# Patient Record
Sex: Male | Born: 1965 | Race: White | Hispanic: No | Marital: Single | Smoking: Current every day smoker
Health system: Southern US, Community
[De-identification: ages and names within clinical notes are randomized; demographics above are authoritative.]

## PROBLEM LIST (undated history)

## (undated) DIAGNOSIS — I10 Essential (primary) hypertension: Secondary | ICD-10-CM

## (undated) DIAGNOSIS — K219 Gastro-esophageal reflux disease without esophagitis: Secondary | ICD-10-CM

## (undated) DIAGNOSIS — M792 Neuralgia and neuritis, unspecified: Secondary | ICD-10-CM

## (undated) HISTORY — PX: GASTRIC BYPASS: SHX52

---

## 2011-03-05 ENCOUNTER — Emergency Department: Payer: Self-pay | Admitting: Emergency Medicine

## 2012-01-19 ENCOUNTER — Emergency Department (HOSPITAL_COMMUNITY): Payer: Self-pay

## 2012-01-19 ENCOUNTER — Emergency Department (HOSPITAL_COMMUNITY)
Admission: EM | Admit: 2012-01-19 | Discharge: 2012-01-19 | Disposition: A | Discharge status: Home Health | Payer: Self-pay | Attending: Emergency Medicine | Admitting: Emergency Medicine

## 2012-01-19 ENCOUNTER — Encounter (HOSPITAL_COMMUNITY): Payer: Self-pay

## 2012-01-19 ENCOUNTER — Other Ambulatory Visit: Payer: Self-pay

## 2012-01-19 DIAGNOSIS — R1110 Vomiting, unspecified: Secondary | ICD-10-CM | POA: Insufficient documentation

## 2012-01-19 DIAGNOSIS — Z79899 Other long term (current) drug therapy: Secondary | ICD-10-CM | POA: Insufficient documentation

## 2012-01-19 DIAGNOSIS — S91109A Unspecified open wound of unspecified toe(s) without damage to nail, initial encounter: Secondary | ICD-10-CM | POA: Insufficient documentation

## 2012-01-19 DIAGNOSIS — R0602 Shortness of breath: Secondary | ICD-10-CM | POA: Insufficient documentation

## 2012-01-19 DIAGNOSIS — R059 Cough, unspecified: Secondary | ICD-10-CM | POA: Insufficient documentation

## 2012-01-19 DIAGNOSIS — X58XXXA Exposure to other specified factors, initial encounter: Secondary | ICD-10-CM | POA: Insufficient documentation

## 2012-01-19 DIAGNOSIS — E669 Obesity, unspecified: Secondary | ICD-10-CM | POA: Insufficient documentation

## 2012-01-19 DIAGNOSIS — I10 Essential (primary) hypertension: Secondary | ICD-10-CM | POA: Insufficient documentation

## 2012-01-19 DIAGNOSIS — R05 Cough: Secondary | ICD-10-CM | POA: Insufficient documentation

## 2012-01-19 DIAGNOSIS — J3489 Other specified disorders of nose and nasal sinuses: Secondary | ICD-10-CM | POA: Insufficient documentation

## 2012-01-19 DIAGNOSIS — M549 Dorsalgia, unspecified: Secondary | ICD-10-CM | POA: Insufficient documentation

## 2012-01-19 DIAGNOSIS — E119 Type 2 diabetes mellitus without complications: Secondary | ICD-10-CM | POA: Insufficient documentation

## 2012-01-19 DIAGNOSIS — R079 Chest pain, unspecified: Secondary | ICD-10-CM | POA: Insufficient documentation

## 2012-01-19 DIAGNOSIS — Z794 Long term (current) use of insulin: Secondary | ICD-10-CM | POA: Insufficient documentation

## 2012-01-19 DIAGNOSIS — M940 Chondrocostal junction syndrome [Tietze]: Secondary | ICD-10-CM | POA: Insufficient documentation

## 2012-01-19 HISTORY — DX: Neuralgia and neuritis, unspecified: M79.2

## 2012-01-19 HISTORY — DX: Essential (primary) hypertension: I10

## 2012-01-19 LAB — COMPREHENSIVE METABOLIC PANEL
ALT: 14 U/L (ref 0–53)
AST: 15 U/L (ref 0–37)
CO2: 24 mEq/L (ref 19–32)
Calcium: 9.3 mg/dL (ref 8.4–10.5)
Chloride: 105 mEq/L (ref 96–112)
Creatinine, Ser: 0.9 mg/dL (ref 0.50–1.35)
GFR calc Af Amer: >90 mL/min (ref >90)
GFR calc non Af Amer: >90 mL/min (ref >90)
Glucose, Bld: 109 mg/dL — ABNORMAL HIGH (ref 70–99)
Total Bilirubin: 0.4 mg/dL (ref 0.3–1.2)

## 2012-01-19 LAB — POCT I-STAT TROPONIN I

## 2012-01-19 LAB — CBC
Hemoglobin: 13.2 g/dL (ref 13.0–17.0)
MCH: 29.8 pg (ref 26.0–34.0)
MCV: 84.7 fL (ref 78.0–100.0)
RBC: 4.43 MIL/uL (ref 4.22–5.81)
WBC: 6.1 10*3/uL (ref 4.0–10.5)

## 2012-01-19 LAB — PROTIME-INR: INR: 1.06 (ref 0.00–1.49)

## 2012-01-19 LAB — APTT: aPTT: 29 seconds (ref 24–37)

## 2012-01-19 MED ORDER — NAPROXEN 500 MG PO TABS: 500.0000 mg | ORAL_TABLET | Freq: Two times a day (BID) | ORAL | Status: DC

## 2012-01-19 MED ORDER — MORPHINE SULFATE 4 MG/ML IJ SOLN
4.0000 mg | Freq: Once | INTRAMUSCULAR | Status: AC
  Administered 2012-01-19: 4 mg via INTRAVENOUS
  Filled 2012-01-19: qty 1

## 2012-01-19 MED ORDER — HYDROCODONE-ACETAMINOPHEN 5-325 MG PO TABS: 1.0000 | ORAL_TABLET | Freq: Four times a day (QID) | ORAL | Status: DC | PRN

## 2012-01-19 MED ORDER — SODIUM CHLORIDE 0.9 % IV SOLN
20.0000 mL | INTRAVENOUS | Status: DC
  Administered 2012-01-19: 20 mL via INTRAVENOUS

## 2012-01-19 NOTE — ED Notes (Signed)
Pt presents with midsternal chest pain that woke him from sleep last night.  +shortness of breath and nausea.  Pt reports pain has been constant and radiates straight through to his back.

## 2012-01-19 NOTE — ED Notes (Signed)
Patient denies pain and is resting comfortably.  

## 2012-01-19 NOTE — ED Provider Notes (Signed)
History     CSN: [id number]  Arrival date & time 01/19/12  1029   First MD Initiated Contact with Patient 01/19/12 1120      Chief Complaint  Patient presents with  . Chest Pain    (Consider location/radiation/quality/duration/timing/severity/associated sxs/prior treatment) HPI Pt started last night with a cough.  Pt then developed pain in his mid chest radiating to his back.  The pain is sharp and constant.  It increases with coughing.  He vomited this morning.  No diarrhea or stomach pain.  He had this once before when he was in prison associated with a lung infection.  NO recent trips or travel.  No history of blood clots or heart disease. Past Medical History  Diagnosis Date  . Diabetes mellitus   . Hypertension   . Neuropathic pain     History reviewed. No pertinent past surgical history.  No family history on file.  History  Substance Use Topics  . Smoking status: Current Everyday Smoker -- 0.5 packs/day  . Smokeless tobacco: Not on file  . Alcohol Use: No      Review of Systems  Constitutional: Negative for fever.  HENT: Positive for congestion.   Respiratory: Positive for cough and shortness of breath.   Cardiovascular: Positive for chest pain.  Gastrointestinal: Negative for abdominal pain.  Musculoskeletal: Positive for back pain.  Skin: Negative for pallor.  Neurological: Negative for syncope.  All other systems reviewed and are negative.    Allergies  Review of patient's allergies indicates no known allergies.  Home Medications   Current Outpatient Rx  Name Route Sig Dispense Refill  . ALBUTEROL SULFATE HFA 108 (90 BASE) MCG/ACT IN AERS Inhalation Inhale 2 puffs into the lungs 2 (two) times daily as needed. For shortness of breath.    Marland Kitchen HYDROXYZINE PAMOATE 25 MG PO CAPS Oral Take 25 mg by mouth 2 (two) times daily.     . INSULIN ASPART PROT & ASPART (70-30) 100 UNIT/ML [place] SUSP Subcutaneous Inject 30 Units into the skin 3 (three) times daily.    Marland Kitchen  METFORMIN HCL 500 MG PO TABS Oral Take 500 mg by mouth 2 (two) times daily with a meal.    . RISPERIDONE 0.25 MG PO TABS Oral Take 0.25 mg by mouth 2 (two) times daily.      BP 113/65  Pulse 99  Temp(Src) 99 F (37.2 C) (Oral)  Resp 26  Ht 6\' 1"  (1.854 m)  Wt 240 lb (108.863 kg)  BMI 31.66 kg/m2  SpO2 99%  Physical Exam  Nursing note and vitals reviewed. Constitutional: He appears well-developed and well-nourished. No distress.       obese  HENT:  Head: Normocephalic and atraumatic.  Right Ear: External ear normal.  Left Ear: External ear normal.  Eyes: Conjunctivae are normal. Right eye exhibits no discharge. Left eye exhibits no discharge. No scleral icterus.  Neck: Neck supple. No tracheal deviation present.  Cardiovascular: Normal rate, regular rhythm and intact distal pulses.   Pulmonary/Chest: Effort normal and breath sounds normal. No stridor. No respiratory distress. He has no wheezes. He has no rales. He exhibits tenderness.  Abdominal: Soft. Bowel sounds are normal. He exhibits no distension. There is no tenderness. There is no rebound and no guarding.  Musculoskeletal: He exhibits no edema and no tenderness.       Status post avulsion right great toenail  Neurological: He is alert. He has normal strength. No sensory deficit. Cranial nerve deficit:  no gross defecits  noted. He exhibits normal muscle tone. He displays no seizure activity. Coordination normal.  Skin: Skin is warm and dry. No rash noted.  Psychiatric: He has a normal mood and affect.    ED Course  Procedures (including critical care time)  Date: 01/19/2012  Rate: 105  Rhythm: sinus tachycardia  QRS Axis: normal  Intervals: normal  ST/T Wave abnormalities: normal  Conduction Disutrbances:none  Narrative Interpretation:   Old EKG Reviewed: none available   Labs Reviewed  CBC - Abnormal; Notable for the following:    HCT 37.5 (*)    All other components within normal limits  COMPREHENSIVE  METABOLIC PANEL - Abnormal; Notable for the following:    Potassium 3.2 (*)    Glucose, Bld 109 (*)    Albumin 3.3 (*)    All other components within normal limits  PROTIME-INR  APTT  D-DIMER, QUANTITATIVE  POCT I-STAT TROPONIN I   Dg Chest Portable 1 View  01/19/2012  *RADIOLOGY REPORT*  Clinical Data: Chest pain.  PORTABLE CHEST - 1 VIEW  Comparison: None.  Findings: Trachea is midline.  Heart size normal.  Lungs are low in volume with minimal bibasilar atelectasis.  No pleural fluid.  IMPRESSION: Low lung volumes with minimal bibasilar atelectasis.  Original Report Authenticated By: Reyes Ivan, M.D.     1. Costochondritis       MDM  I suspect the patient is having chest wall pain associated with his file respiratory infection. At this time there is no evidence of pneumonia. He has no pulmonary embolism risk factors and his d-dimer is negative. I doubt aortic dissection, there is no widened mediastinum. Patient will be discharged home with medications for pain        Celene Kras, MD 01/19/12 [phone]

## 2012-01-19 NOTE — Discharge Instructions (Signed)
Costochondritis Costochondritis (Tietze syndrome), or costochondral separation, is a swelling and irritation (inflammation) of the tissue (cartilage) that connects your ribs with your breastbone (sternum). It may occur on its own (spontaneously), through damage caused by an accident (trauma), or simply from coughing or minor exercise. It may take up to 6 weeks to get better and longer if you are unable to be conservative in your activities. HOME CARE INSTRUCTIONS   Avoid exhausting physical activity. Try not to strain your ribs during normal activity. This would include any activities using chest, belly (abdominal), and side muscles, especially if heavy weights are used.   Use ice for 15 to 20 minutes per hour while awake for the first 2 days. Place the ice in a plastic bag, and place a towel between the bag of ice and your skin.   Only take over-the-counter or prescription medicines for pain, discomfort, or fever as directed by your caregiver.  SEEK IMMEDIATE MEDICAL CARE IF:   Your pain increases or you are very uncomfortable.   You have a fever.   You develop difficulty with your breathing.   You cough up blood.   You develop worse chest pains, shortness of breath, sweating, or vomiting.   You develop new, unexplained problems (symptoms).  MAKE SURE YOU:   Understand these instructions.   Will watch your condition.   Will get help right away if you are not doing well or get worse.  Document Released: 08/10/2005 Document Revised: 10/20/2011 Document Reviewed: 06/18/2008 [facility] Patient Information 2012 [place], [place]. RESOURCE GUIDE  Dental Problems  Patients with Medicaid: [facility]                     [phone] [address]                                           Phone:  [phone]                                                  If unable to pay or uninsured, contact:  [facility] or [facility]. to become qualified for the adult dental  clinic.  Chronic Pain Problems Contact [facility]  [phone] Patients need to be referred by their primary care doctor.  Insufficient Money for Medicine Contact United Way:  call "211" or [facility] [phone].  No Primary Care Doctor Call [facility]  [phone] Other agencies that provide inexpensive medical care    [facility]  [phone]    [facility] Internal [facility]  [phone]    [facility]  [phone]    [facility]  [phone]    [facility]  [phone]    [facility] Child [facility]  [phone]  Substance Abuse Resources [facility]  [phone] [facility] [phone] [facility] [phone] Floydene Flock [phone] Residential & Outpatient Substance Abuse Program  [phone]  Psychological Services [facility] Behavioral Health  [phone] [facility]  [phone] [facility] Mental Health   [phone] (emergency services [phone])  Abuse/Neglect [facility] Child Abuse Hotline 947-857-2744 [facility] Child Abuse Hotline [phone] (After Hours)  Emergency Shelter [facility] 6107571704  Maternity Homes  Room at the [facility] [place] [facility] [phone] [facility] [phone]  MRSA Hotline #:   667-356-9767    [facility] Resources  [facility]  [employer]                           [facility] Dept. [address] [place]                     [address]         [address]  Blondell Reveal Phone:  [phone]                                  Phone:  [phone]                   Phone:  [phone]  [facility] Mental Health Phone:  [phone]  [facility] Child Abuse Hotline 225-675-8147 [phone] (After Hours)

## 2012-01-19 NOTE — ED Notes (Signed)
Recently released from prison, states no medications since then and has been living in a shelter. States cough last pm and awoke with severe sob and cp this am.

## 2012-01-20 ENCOUNTER — Emergency Department (HOSPITAL_COMMUNITY)
Admission: EM | Admit: 2012-01-20 | Discharge: 2012-01-23 | Disposition: A | Discharge status: Home / Self Care | Payer: Self-pay | Attending: Emergency Medicine | Admitting: Emergency Medicine

## 2012-01-20 DIAGNOSIS — R45851 Suicidal ideations: Secondary | ICD-10-CM

## 2012-01-20 DIAGNOSIS — R5381 Other malaise: Secondary | ICD-10-CM | POA: Insufficient documentation

## 2012-01-20 DIAGNOSIS — F3289 Other specified depressive episodes: Secondary | ICD-10-CM | POA: Insufficient documentation

## 2012-01-20 DIAGNOSIS — I10 Essential (primary) hypertension: Secondary | ICD-10-CM | POA: Insufficient documentation

## 2012-01-20 DIAGNOSIS — F329 Major depressive disorder, single episode, unspecified: Secondary | ICD-10-CM | POA: Insufficient documentation

## 2012-01-20 DIAGNOSIS — R5383 Other fatigue: Secondary | ICD-10-CM | POA: Insufficient documentation

## 2012-01-20 DIAGNOSIS — F32A Depression, unspecified: Secondary | ICD-10-CM

## 2012-01-20 DIAGNOSIS — Z794 Long term (current) use of insulin: Secondary | ICD-10-CM | POA: Insufficient documentation

## 2012-01-20 DIAGNOSIS — E119 Type 2 diabetes mellitus without complications: Secondary | ICD-10-CM | POA: Insufficient documentation

## 2012-01-20 DIAGNOSIS — R079 Chest pain, unspecified: Secondary | ICD-10-CM | POA: Insufficient documentation

## 2012-01-20 LAB — COMPREHENSIVE METABOLIC PANEL
Albumin: 3.8 g/dL (ref 3.5–5.2)
Alkaline Phosphatase: 80 U/L (ref 39–117)
BUN: 16 mg/dL (ref 6–23)
CO2: 21 mEq/L (ref 19–32)
Chloride: 104 mEq/L (ref 96–112)
Creatinine, Ser: 0.81 mg/dL (ref 0.50–1.35)
GFR calc Af Amer: >90 mL/min (ref >90)
GFR calc non Af Amer: >90 mL/min (ref >90)
Glucose, Bld: 128 mg/dL — ABNORMAL HIGH (ref 70–99)
Potassium: 3.4 mEq/L — ABNORMAL LOW (ref 3.5–5.1)
Total Bilirubin: 0.4 mg/dL (ref 0.3–1.2)

## 2012-01-20 LAB — CBC
HCT: 38.9 % — ABNORMAL LOW (ref 39.0–52.0)
Hemoglobin: 13.6 g/dL (ref 13.0–17.0)
MCV: 83.7 fL (ref 78.0–100.0)
RDW: 14.4 % (ref 11.5–15.5)
WBC: 5.1 10*3/uL (ref 4.0–10.5)

## 2012-01-20 LAB — ETHANOL: Alcohol, Ethyl (B): <11 mg/dL (ref 0–11)

## 2012-01-20 LAB — RAPID URINE DRUG SCREEN, HOSP PERFORMED
Barbiturates: NOT DETECTED
Benzodiazepines: NOT DETECTED

## 2012-01-20 LAB — ACETAMINOPHEN LEVEL: Acetaminophen (Tylenol), Serum: <15 ug/mL (ref 10–30)

## 2012-01-20 LAB — GLUCOSE, CAPILLARY: Glucose-Capillary: 123 mg/dL — ABNORMAL HIGH (ref 70–99)

## 2012-01-20 MED ORDER — HYDROCOD POLST-CHLORPHEN POLST 10-8 MG/5ML PO LQCR
5.0000 mL | Freq: Once | ORAL | Status: AC
  Administered 2012-01-20: 5 mL via ORAL
  Filled 2012-01-20: qty 5

## 2012-01-20 MED ORDER — LORAZEPAM 1 MG PO TABS
1.0000 mg | ORAL_TABLET | Freq: Three times a day (TID) | ORAL | Status: DC | PRN
  Administered 2012-01-22: 1 mg via ORAL
  Filled 2012-01-20: qty 1

## 2012-01-20 NOTE — ED Notes (Signed)
Pt lives at the shelter stating he has a lot of suicidal thoughts "real bad" this last week.  Called the mobile crisis unit.  Here w/[employer].

## 2012-01-20 NOTE — ED Notes (Signed)
Pt states he has been depressed and feels suicidal with a plan to jump off a bridge into the path of a train.  Denies HI.  Says the depression worsened after being released from prison last month.

## 2012-01-20 NOTE — ED Provider Notes (Signed)
History     CSN: [id number]  Arrival date & time 01/20/12  1456   First MD Initiated Contact with Patient 01/20/12 1548      Chief Complaint  Patient presents with  . Suicidal    (Consider location/radiation/quality/duration/timing/severity/associated sxs/prior treatment) HPI Comments: Recently released from jail.  Staying in homeless shelter.    Patient is a 46 y.o. male presenting with mental health disorder. The history is provided by the patient.  Mental Health Problem The primary symptoms include dysphoric mood. The current episode started more than 1 month ago. This is a recurrent problem.  The degree of incapacity that he is experiencing as a consequence of his illness is severe. Sequelae of the illness include an inability to work, harmed interpersonal relations, an inability to care for self and homelessness. Additional symptoms of the illness include anhedonia, insomnia, fatigue and feelings of worthlessness. He admits to suicidal ideas. He does have a plan to commit suicide. He contemplates harming himself. He has not already injured self. He does not contemplate injuring another person. He has not already  injured another person. Risk factors that are present for mental illness include a history of mental illness.    Past Medical History  Diagnosis Date  . Diabetes mellitus   . Hypertension   . Neuropathic pain     No past surgical history on file.  No family history on file.  History  Substance Use Topics  . Smoking status: Current Everyday Smoker -- 0.5 packs/day  . Smokeless tobacco: Not on file  . Alcohol Use: No      Review of Systems  Constitutional: Positive for fatigue.  Psychiatric/Behavioral: Positive for dysphoric mood. The patient has insomnia.   All other systems reviewed and are negative.    Allergies  Review of patient's allergies indicates no known allergies.  Home Medications   Current Outpatient Rx  Name Route Sig Dispense Refill    . ALBUTEROL SULFATE HFA 108 (90 BASE) MCG/ACT IN AERS Inhalation Inhale 2 puffs into the lungs 2 (two) times daily as needed. For shortness of breath.    . BENZTROPINE MESYLATE 1 MG PO TABS Oral Take 1 mg by mouth 2 (two) times daily.    . INSULIN ASPART PROT & ASPART (70-30) 100 UNIT/ML [place] SUSP Subcutaneous Inject 30 Units into the skin 3 (three) times daily.    Marland Kitchen METFORMIN HCL 500 MG PO TABS Oral Take 500 mg by mouth 2 (two) times daily with a meal.    . NAPROXEN 500 MG PO TABS Oral Take 1 tablet (500 mg total) by mouth 2 (two) times daily. 30 tablet 0  . RISPERIDONE 0.25 MG PO TABS Oral Take 0.25 mg by mouth 2 (two) times daily.      BP 119/72  Pulse 79  Temp 98.5 F (36.9 C)  Resp 20  SpO2 97%  Physical Exam  Nursing note and vitals reviewed. Constitutional: He appears well-developed and well-nourished. No distress.  HENT:  Head: Normocephalic and atraumatic.  Neck: Normal range of motion. Neck supple.  Cardiovascular: Normal rate and regular rhythm.   No murmur heard. Pulmonary/Chest: Effort normal and breath sounds normal. No respiratory distress.  Abdominal: Soft. Bowel sounds are normal.  Musculoskeletal: Normal range of motion. He exhibits no edema.  Lymphadenopathy:    He has no cervical adenopathy.  Neurological: He is alert.  Skin: Skin is warm and dry. He is not diaphoretic.  Psychiatric:       Affect flat, speech slow.  ED Course  Procedures (including critical care time)   Labs Reviewed  CBC  COMPREHENSIVE METABOLIC PANEL  ETHANOL  ACETAMINOPHEN LEVEL  URINE RAPID DRUG SCREEN (HOSP PERFORMED)   Dg Chest Portable 1 View  01/19/2012  *RADIOLOGY REPORT*  Clinical Data: Chest pain.  PORTABLE CHEST - 1 VIEW  Comparison: None.  Findings: Trachea is midline.  Heart size normal.  Lungs are low in volume with minimal bibasilar atelectasis.  No pleural fluid.  IMPRESSION: Low lung volumes with minimal bibasilar atelectasis.  Original Report Authenticated By:  Reyes Ivan, M.D.     No diagnosis found.    MDM  Act team consulted, working on disposition.        Geoffery Lyons, MD 01/22/12 1213

## 2012-01-20 NOTE — BH Assessment (Signed)
Assessment Note   Randall Carroll is a 46 y.o. male who presents to [facility] ED voluntarily, endorsing SI with plan to jump off bridge. Pt reports he was released from jail 01/07/12 and was taken to a homeless shelter. He reports he was given no housing resources at that time. He reports since being at the shelter he had an increase in depressive symptoms and SI. He reports he currently has a plan to jump off a bridge. Pt reports he has attempted suicide 7 times since the death of his girlfriend in 1983/05/02. He reports he has attempted to overdoes, hang himself, and cut his wrists. He states he has been hospitalized 7 times, 5 times in [place], 1 time in [place], and 1 time at [facility]. Pt reports he has a history of schizophrenia, bipolar, mood disorder, and PTSD. He endorses [facility], stating he hears the voices of his deceased mother and girlfriend telling him to "come be with [place], It's better over here." Pt reports he was prescribed Seroquel but is non-compliant because it makes him "want to hurt people bad."  Pt denies current HI.  Pt reports no current psychiatrist. He reports he has seen Barbaraann Barthel at [facility] time. Pt is currently on probation for breaking and entering. Pt is voluntarily seeking inpatient treatment at this time.      Axis I: Mood Disorder NOS Axis II: Deferred Axis III:  Past Medical History  Diagnosis Date  . Diabetes mellitus   . Hypertension   . Neuropathic pain    Axis IV: housing problems, problems related to legal system/crime and problems related to social environment Axis V: 31-40 impairment in reality testing  Past Medical History:  Past Medical History  Diagnosis Date  . Diabetes mellitus   . Hypertension   . Neuropathic pain     No past surgical history on file.  Family History: No family history on file.  Social History:  reports that he has been smoking.  He does not have any smokeless tobacco history on file. He reports that he  does not drink alcohol or use illicit drugs.  Additional Social History:  Alcohol / Drug Use History of alcohol / drug use?: No history of alcohol / drug abuse Allergies: No Known Allergies  Home Medications:  Medications Prior to Admission  Medication Dose Route Frequency Provider Last Rate Last Dose  . chlorpheniramine-HYDROcodone (TUSSIONEX) 10-8 MG/5ML suspension 5 mL  5 mL Oral Once Geoffery Lyons, MD   5 mL at 01/20/12 05/01/2002  . LORazepam (ATIVAN) tablet 1 mg  1 mg Oral Q8H PRN Geoffery Lyons, MD      . morphine 4 MG/ML injection 4 mg  4 mg Intravenous Once Celene Kras, MD   4 mg at 01/19/12 1150  . DISCONTD: 0.9 %  sodium chloride infusion  20 mL Intravenous Continuous Celene Kras, MD 10 mL/hr at 01/19/12 1150 20 mL at 01/19/12 1150   Medications Prior to Admission  Medication Sig Dispense Refill  . albuterol (PROVENTIL HFA;VENTOLIN HFA) 108 (90 BASE) MCG/ACT inhaler Inhale 2 puffs into the lungs 2 (two) times daily as needed. For shortness of breath.      . insulin aspart protamine-insulin aspart (NOVOLOG 70/30) (70-30) 100 UNIT/ML injection Inject 30 Units into the skin 3 (three) times daily.      . metFORMIN (GLUCOPHAGE) 500 MG tablet Take 500 mg by mouth 2 (two) times daily with a meal.      .  naproxen (NAPROSYN) 500 MG tablet Take 1 tablet (500 mg total) by mouth 2 (two) times daily.  30 tablet  0  . risperiDONE (RISPERDAL) 0.25 MG tablet Take 0.25 mg by mouth 2 (two) times daily.        OB/GYN Status:  No LMP for male patient.  General Assessment Data Location of Assessment: [facility] ED Living Arrangements: Homeless Can pt return to current living arrangement?: Yes Admission Status: Voluntary Is patient capable of signing voluntary admission?: Yes Transfer from: Acute Hospital Referral Source: Self/Family/Friend  Education Status Is patient currently in school?: No  Risk to self Suicidal Ideation: Yes-Currently Present Suicidal Intent: Yes-Currently Present Is patient at risk  for suicide?: Yes Suicidal Plan?: Yes-Currently Present Specify Current Suicidal Plan: plan to jump off bridge Access to Means: Yes Specify Access to Suicidal Means: bridge What has been your use of drugs/alcohol within the last 12 months?: denies Previous Attempts/Gestures: Yes How many times?: 7  Other Self Harm Risks: none Triggers for Past Attempts: Unpredictable Intentional Self Injurious Behavior: None Family Suicide History: No Recent stressful life event(s): Other (Comment) (recently released from jail) Persecutory voices/beliefs?: No Depression: Yes Depression Symptoms: Feeling angry/irritable;Feeling worthless/self pity;Isolating Substance abuse history and/or treatment for substance abuse?: No Suicide prevention information given to non-admitted patients: Not applicable  Risk to Others Homicidal Ideation: No Thoughts of Harm to Others: No Current Homicidal Intent: No Current Homicidal Plan: No Access to Homicidal Means: No Identified Victim: none History of harm to others?: No Assessment of Violence: None Noted Violent Behavior Description: none Does patient have access to weapons?: No Criminal Charges Pending?: No (currently on probabtion) Does patient have a court date: No Court Date:  (pt is unsure )  Psychosis Hallucinations: Auditory;Visual Delusions: None noted  Mental Status Report Appear/Hygiene: Disheveled Eye Contact: Good Motor Activity: Unremarkable Speech: Logical/coherent Level of Consciousness: Alert Mood: Apathetic Affect: Inconsistent with thought content (smiling when talking about jumpting off a bridge) Anxiety Level: Minimal Thought Processes: Relevant;Coherent Judgement: Impaired Orientation: Person;Place;Time;Situation Obsessive Compulsive Thoughts/Behaviors: None  Cognitive Functioning Concentration: Normal Memory: Recent Intact;Remote Intact IQ: Average Insight: Fair Impulse Control: Poor Appetite: Good Weight Loss: 0    Weight Gain: 0  Sleep: Decreased Vegetative Symptoms: None  Prior Inpatient Therapy Prior Inpatient Therapy: Yes Prior Therapy Dates: off and on since 1984 Prior Therapy Facilty/Provider(s): In [place], [place], and [place] [facility] Reason for Treatment: suicide attempts  Prior Outpatient Therapy Prior Outpatient Therapy: No  ADL Screening (condition at time of admission) Patient's cognitive ability adequate to safely complete daily activities?: Yes Patient able to express need for assistance with ADLs?: Yes Independently performs ADLs?: Yes  Home Assistive Devices/Equipment Home Assistive Devices/Equipment: None    Abuse/Neglect Assessment (Assessment to be complete while patient is alone) Physical Abuse: Denies Verbal Abuse: Denies Sexual Abuse: Denies Exploitation of patient/patient's resources: Denies Self-Neglect: Denies     Merchant navy officer (For Healthcare) Advance Directive: Patient does not have advance directive;Patient would not like information Nutrition Screen Diet: Regular Unintentional weight loss greater than 10lbs within the last month: No Dysphagia: No Home Tube Feeding or Total Parenteral Nutrition (TPN): No Patient appears severely malnourished: No Pregnant or Lactating: No  Additional Information 1:1 In Past 12 Months?: No CIRT Risk: No Elopement Risk: No Does patient have medical clearance?: Yes     Disposition:  Disposition Disposition of Patient: Inpatient treatment program;Referred to [facility]) Type of inpatient treatment program: Adult  On Site Evaluation by:   Reviewed with Physician:  Georgina Quint A 01/20/2012 9:39 PM

## 2012-01-21 ENCOUNTER — Encounter (HOSPITAL_COMMUNITY): Payer: Self-pay

## 2012-01-21 ENCOUNTER — Emergency Department (HOSPITAL_COMMUNITY): Payer: Self-pay

## 2012-01-21 LAB — URINALYSIS, ROUTINE W REFLEX MICROSCOPIC
Bilirubin Urine: NEGATIVE
Glucose, UA: 500 mg/dL — AB
Hgb urine dipstick: NEGATIVE
Protein, ur: NEGATIVE mg/dL

## 2012-01-21 LAB — GLUCOSE, CAPILLARY
Glucose-Capillary: 129 mg/dL — ABNORMAL HIGH (ref 70–99)
Glucose-Capillary: 103 mg/dL — ABNORMAL HIGH (ref 70–99)
Glucose-Capillary: 187 mg/dL — ABNORMAL HIGH (ref 70–99)

## 2012-01-21 LAB — RAPID STREP SCREEN (MED CTR MEBANE ONLY): Streptococcus, Group A Screen (Direct): NEGATIVE

## 2012-01-21 MED ORDER — NAPROXEN 500 MG PO TABS
500.0000 mg | ORAL_TABLET | Freq: Two times a day (BID) | ORAL | Status: DC
  Administered 2012-01-21 – 2012-01-23 (×5): 500 mg via ORAL
  Filled 2012-01-21 (×5): qty 1

## 2012-01-21 MED ORDER — BENZTROPINE MESYLATE 1 MG PO TABS
1.0000 mg | ORAL_TABLET | Freq: Two times a day (BID) | ORAL | Status: DC
Start: 2012-01-21 — End: 2012-01-23
  Administered 2012-01-21 – 2012-01-23 (×5): 1 mg via ORAL
  Filled 2012-01-21 (×5): qty 1

## 2012-01-21 MED ORDER — RISPERIDONE 0.5 MG PO TABS
0.2500 mg | ORAL_TABLET | Freq: Two times a day (BID) | ORAL | Status: DC
  Administered 2012-01-21 (×2): 0.25 mg via ORAL
  Administered 2012-01-22: 0.5 mg via ORAL
  Administered 2012-01-22 – 2012-01-23 (×2): 0.25 mg via ORAL
  Filled 2012-01-21 (×5): qty 1

## 2012-01-21 MED ORDER — INSULIN ASPART PROT & ASPART (70-30 MIX) 100 UNIT/ML ~~LOC~~ SUSP
30.0000 [IU] | Freq: Three times a day (TID) | SUBCUTANEOUS | Status: DC
Start: 2012-01-21 — End: 2012-01-21
  Filled 2012-01-21: qty 3

## 2012-01-21 MED ORDER — ONDANSETRON 4 MG PO TBDP
4.0000 mg | ORAL_TABLET | Freq: Once | ORAL | Status: AC
  Administered 2012-01-21: 4 mg via ORAL
  Filled 2012-01-21: qty 1

## 2012-01-21 MED ORDER — OXYMETAZOLINE HCL 0.05 % NA SOLN
1.0000 | Freq: Two times a day (BID) | NASAL | Status: DC | PRN
  Administered 2012-01-21 – 2012-01-23 (×3): 1 via NASAL
  Filled 2012-01-21: qty 15

## 2012-01-21 MED ORDER — INSULIN ASPART PROT & ASPART (70-30 MIX) 100 UNIT/ML ~~LOC~~ SUSP
30.0000 [IU] | Freq: Two times a day (BID) | SUBCUTANEOUS | Status: DC
  Administered 2012-01-21: 30 [IU] via SUBCUTANEOUS

## 2012-01-21 MED ORDER — GUAIFENESIN-CODEINE 100-10 MG/5ML PO SOLN
10.0000 mL | Freq: Once | ORAL | Status: AC
  Administered 2012-01-21: 10 mL via ORAL
  Filled 2012-01-21: qty 10

## 2012-01-21 MED ORDER — ALBUTEROL SULFATE HFA 108 (90 BASE) MCG/ACT IN AERS: 2.0000 | INHALATION_SPRAY | RESPIRATORY_TRACT | Status: DC | PRN

## 2012-01-21 MED ORDER — ACETAMINOPHEN 325 MG PO TABS
650.0000 mg | ORAL_TABLET | Freq: Once | ORAL | Status: AC
  Administered 2012-01-21: 650 mg via ORAL
  Filled 2012-01-21: qty 2

## 2012-01-21 MED ORDER — INSULIN ASPART PROT & ASPART (70-30 MIX) 100 UNIT/ML ~~LOC~~ SUSP
30.0000 [IU] | Freq: Two times a day (BID) | SUBCUTANEOUS | Status: DC
  Administered 2012-01-21 – 2012-01-22 (×2): 30 [IU] via SUBCUTANEOUS

## 2012-01-21 MED ORDER — ACETAMINOPHEN 325 MG PO TABS
650.0000 mg | ORAL_TABLET | Freq: Four times a day (QID) | ORAL | Status: DC | PRN
  Administered 2012-01-21: 650 mg via ORAL
  Filled 2012-01-21: qty 2

## 2012-01-21 MED ORDER — METFORMIN HCL 500 MG PO TABS
500.0000 mg | ORAL_TABLET | Freq: Two times a day (BID) | ORAL | Status: DC
  Administered 2012-01-21 – 2012-01-23 (×4): 500 mg via ORAL
  Filled 2012-01-21 (×9): qty 1

## 2012-01-21 NOTE — ED Notes (Signed)
Pt stated he has diarrhea and was incont of stool while he was sleeping. Stated he has had diarrhea for last 24 hours.  He is asking for med for the diarrhea.

## 2012-01-21 NOTE — ED Notes (Signed)
Spoke with Idalia Needle pt is pending a bed at [facility] no need for tele psych

## 2012-01-21 NOTE — ED Provider Notes (Signed)
Pt alert, content. Vitals normal. Act eval/telepsych pending.   Suzi Roots, MD 01/21/12 1024

## 2012-01-21 NOTE — ED Provider Notes (Addendum)
BP 110/74  Pulse 96  Temp(Src) 100.7 F (38.2 C) (Oral)  Resp 20  SpO2 100%   Patient evaluated by me. Low grade temp. C/O cough, min sob, nasal congestion, sore throat, diarrhea without abdominal pain. No blood in stools. Lungs CTAB. Mild posterior OP erythema. Abd without ttp. Rapid strep, CXR, Antitussive, afrin ordered. Reassess.  Forbes Cellar, MD 01/21/12 2212  CXR, U/A unremarkable for infection. Mild bronchitis changes. No wheezing on exam. Rapid strep negative. Will send for culture. Medically clear for psych admission.  Forbes Cellar, MD 01/22/12 0010

## 2012-01-21 NOTE — ED Notes (Signed)
EDP in room with pt reports that she is going to place new orders I spoke with Dois Davenport RN CN will move pt to main ed to place in resp precautions

## 2012-01-22 LAB — GLUCOSE, CAPILLARY: Glucose-Capillary: 87 mg/dL (ref 70–99)

## 2012-01-22 MED ORDER — INSULIN ASPART PROT & ASPART (70-30 MIX) 100 UNIT/ML ~~LOC~~ SUSP
30.0000 [IU] | Freq: Two times a day (BID) | SUBCUTANEOUS | Status: DC
  Administered 2012-01-23: 30 [IU] via SUBCUTANEOUS

## 2012-01-22 NOTE — ED Notes (Signed)
Pt upset that room has cold air blowing over top of his bed and he's already sick. He says we are trying to get him sicker. Repositioned bed. Pt c/o not having a remote for his T.V. Got remote from bedside table of pt that had it removed from their room. Pt demanding to see MD, says he voluntarily came here and can voluntarily leave here. MD notified; Mcmanus said to IVC pt.

## 2012-01-22 NOTE — ED Notes (Signed)
Walked pt over to room 37 to see if it was warmer than pt's current room. Pt declined switching rooms at this time.

## 2012-01-22 NOTE — BH Assessment (Signed)
Assessment Note   Randall Carroll is an 46 y.o. male.   Pt continues to need treatment unless Tele Psych and ED MD determine otherwise.  Pt has struggled today with his room temperature due to cold air blowing on him.  Pt has prior hx of suicidal attempts (7) and it is difficult to recommend discharge with past hx and current needs of pt.  Pt denies HI and SA.  Pt has AVH related issues associated with hearing and seeing his deceased mother and girlfriend.  Pt had plan to walk into traffic or jump off a bridge.    Axis I: Mood Disorder NOS Axis II: Deferred Axis III:  Past Medical History  Diagnosis Date  . Diabetes mellitus   . Hypertension   . Neuropathic pain    Axis IV: economic problems, housing problems, occupational problems, other psychosocial or environmental problems, problems related to legal system/crime, problems related to social environment and problems with primary support group Axis V: 31-40 impairment in reality testing  Past Medical History:  Past Medical History  Diagnosis Date  . Diabetes mellitus   . Hypertension   . Neuropathic pain     History reviewed. No pertinent past surgical history.  Family History: History reviewed. No pertinent family history.  Social History:  reports that he has been smoking.  He does not have any smokeless tobacco history on file. He reports that he does not drink alcohol or use illicit drugs.  Additional Social History:  Alcohol / Drug Use History of alcohol / drug use?: No history of alcohol / drug abuse Allergies: No Known Allergies  Home Medications:  Medications Prior to Admission  Medication Dose Route Frequency Provider Last Rate Last Dose  . acetaminophen (TYLENOL) tablet 650 mg  650 mg Oral Once Geoffery Lyons, MD   650 mg at 01/21/12 0050  . acetaminophen (TYLENOL) tablet 650 mg  650 mg Oral Q6H PRN Arman Filter, NP   650 mg at 01/21/12 2122  . albuterol (PROVENTIL HFA;VENTOLIN HFA) 108 (90 BASE) MCG/ACT inhaler 2 puff   2 puff Inhalation Q4H PRN John L Molpus, MD      . benztropine (COGENTIN) tablet 1 mg  1 mg Oral BID Carlisle Beers Molpus, MD   1 mg at 01/22/12 0919  . chlorpheniramine-HYDROcodone (TUSSIONEX) 10-8 MG/5ML suspension 5 mL  5 mL Oral Once Geoffery Lyons, MD   5 mL at 01/20/12 2003  . guaiFENesin-codeine 100-10 MG/5ML solution 10 mL  10 mL Oral Once Forbes Cellar, MD   10 mL at 01/21/12 2232  . insulin aspart protamine-insulin aspart (NOVOLOG 70/30) injection 30 Units  30 Units Subcutaneous BID Geoffery Lyons, MD      . LORazepam (ATIVAN) tablet 1 mg  1 mg Oral Q8H PRN Geoffery Lyons, MD      . metFORMIN (GLUCOPHAGE) tablet 500 mg  500 mg Oral BID WC Carlisle Beers Molpus, MD   500 mg at 01/22/12 0916  . morphine 4 MG/ML injection 4 mg  4 mg Intravenous Once Celene Kras, MD   4 mg at 01/19/12 1150  . naproxen (NAPROSYN) tablet 500 mg  500 mg Oral BID Carlisle Beers Molpus, MD   500 mg at 01/22/12 0919  . ondansetron (ZOFRAN-ODT) disintegrating tablet 4 mg  4 mg Oral Once Arman Filter, NP   4 mg at 01/21/12 0550  . oxymetazoline (AFRIN) 0.05 % nasal spray 1 spray  1 spray Each Nare BID PRN Forbes Cellar, MD   1 spray  at 01/22/12 1349  . risperiDONE (RISPERDAL) tablet 0.25 mg  0.25 mg Oral BID Carlisle Beers Molpus, MD   0.25 mg at 01/22/12 0920  . DISCONTD: 0.9 %  sodium chloride infusion  20 mL Intravenous Continuous Celene Kras, MD 10 mL/hr at 01/19/12 1150 20 mL at 01/19/12 1150  . DISCONTD: insulin aspart protamine-insulin aspart (NOVOLOG 70/30) injection 30 Units  30 Units Subcutaneous TID WC John L Molpus, MD      . DISCONTD: insulin aspart protamine-insulin aspart (NOVOLOG 70/30) injection 30 Units  30 Units Subcutaneous BID WC Suzi Roots, MD   30 Units at 01/21/12 0900  . DISCONTD: insulin aspart protamine-insulin aspart (NOVOLOG 70/30) injection 30 Units  30 Units Subcutaneous BID WC Geoffery Lyons, MD   30 Units at 01/22/12 0840   Medications Prior to Admission  Medication Sig Dispense Refill  . albuterol (PROVENTIL  HFA;VENTOLIN HFA) 108 (90 BASE) MCG/ACT inhaler Inhale 2 puffs into the lungs 2 (two) times daily as needed. For shortness of breath.      . insulin aspart protamine-insulin aspart (NOVOLOG 70/30) (70-30) 100 UNIT/ML injection Inject 30 Units into the skin 3 (three) times daily.      . metFORMIN (GLUCOPHAGE) 500 MG tablet Take 500 mg by mouth 2 (two) times daily with a meal.      . naproxen (NAPROSYN) 500 MG tablet Take 1 tablet (500 mg total) by mouth 2 (two) times daily.  30 tablet  0  . risperiDONE (RISPERDAL) 0.25 MG tablet Take 0.25 mg by mouth 2 (two) times daily.        OB/GYN Status:  No LMP for male patient.  General Assessment Data Location of Assessment: [facility] ED ACT Assessment: Yes Living Arrangements: Homeless Can pt return to current living arrangement?: Yes Admission Status: Voluntary Is patient capable of signing voluntary admission?: Yes Transfer from: Acute Hospital Referral Source: MD  Education Status Is patient currently in school?: No  Risk to self Suicidal Ideation: Yes-Currently Present Suicidal Intent: Yes-Currently Present Is patient at risk for suicide?: Yes Suicidal Plan?: Yes-Currently Present Specify Current Suicidal Plan: plan to jump off bridge on 01-20-12; hx of attempts Access to Means: Yes Specify Access to Suicidal Means: can walk What has been your use of drugs/alcohol within the last 12 months?: denies Previous Attempts/Gestures: Yes How many times?: 7  Other Self Harm Risks: none Triggers for Past Attempts: Unpredictable Intentional Self Injurious Behavior: None Family Suicide History: No Recent stressful life event(s): Other (Comment) (recently released from jail; homeless) Persecutory voices/beliefs?: No Depression: Yes Depression Symptoms: Tearfulness;Isolating;Fatigue;Guilt;Loss of interest in usual pleasures;Feeling worthless/self pity;Feeling angry/irritable Substance abuse history and/or treatment for substance abuse?: No Suicide  prevention information given to non-admitted patients: Not applicable  Risk to Others Homicidal Ideation: No Thoughts of Harm to Others: No Current Homicidal Intent: No Current Homicidal Plan: No Access to Homicidal Means: No Identified Victim: none History of harm to others?: No Assessment of Violence: None Noted Violent Behavior Description: none Does patient have access to weapons?: No Criminal Charges Pending?: No (on probation) Does patient have a court date: No Court Date:  (unsure)  Psychosis Hallucinations: Auditory Delusions: Unspecified  Mental Status Report Appear/Hygiene: Disheveled Eye Contact: Fair Motor Activity: Unremarkable Speech: Logical/coherent Level of Consciousness: Alert Mood: Depressed Affect: Inconsistent with thought content Anxiety Level: Minimal Thought Processes: Relevant Judgement: Impaired Orientation: Person;Place;Time;Situation Obsessive Compulsive Thoughts/Behaviors: None  Cognitive Functioning Concentration: Decreased Memory: Recent Intact;Remote Intact IQ: Average Insight: Poor Impulse Control: Poor Appetite: Fair Weight  Loss: 0  Weight Gain: 0  Sleep: Decreased Total Hours of Sleep: 8  (in ED) Vegetative Symptoms: None  Prior Inpatient Therapy Prior Inpatient Therapy: Yes Prior Therapy Dates: off and on since 1984 Prior Therapy Facilty/Provider(s): In [place], [place], and [place] [facility] Reason for Treatment: suicide attempts  Prior Outpatient Therapy Prior Outpatient Therapy: No Prior Therapy Dates: na Prior Therapy Facilty/Provider(s): na Reason for Treatment: na  ADL Screening (condition at time of admission) Patient's cognitive ability adequate to safely complete daily activities?: Yes Patient able to express need for assistance with ADLs?: Yes Independently performs ADLs?: Yes  Home Assistive Devices/Equipment Home Assistive Devices/Equipment: None    Abuse/Neglect Assessment (Assessment to be  complete while patient is alone) Physical Abuse: Denies Verbal Abuse: Denies Sexual Abuse: Denies Exploitation of patient/patient's resources: Denies Self-Neglect: Denies Values / Beliefs Cultural Requests During Hospitalization: None Spiritual Requests During Hospitalization: None   Advance Directives (For Healthcare) Advance Directive: Patient does not have advance directive;Patient would not like information Nutrition Screen Diet: Regular Unintentional weight loss greater than 10lbs within the last month: No Dysphagia: No Home Tube Feeding or Total Parenteral Nutrition (TPN): No Patient appears severely malnourished: No Pregnant or Lactating: No  Additional Information 1:1 In Past 12 Months?: No CIRT Risk: No Elopement Risk: No Does patient have medical clearance?: Yes     Disposition:   Pt to be reviewed once bed is available at [facility] Disposition Disposition of Patient: Inpatient treatment program;Referred to Type of inpatient treatment program: Adult Patient referred to: Other (Comment) [facility] for review)  On Site Evaluation by:   Reviewed with Physician:     Titus Mould, Eppie Gibson 01/22/2012 6:16 PM

## 2012-01-23 LAB — GLUCOSE, CAPILLARY: Glucose-Capillary: 94 mg/dL (ref 70–99)

## 2012-01-23 LAB — STREP A DNA PROBE: Group A Strep Probe: NEGATIVE

## 2012-01-23 MED ORDER — LITHIUM CARBONATE 300 MG PO TABS: 300.0000 mg | ORAL_TABLET | Freq: Two times a day (BID) | ORAL | Status: DC

## 2012-01-23 MED ORDER — RISPERIDONE 1 MG PO TABS: 1.0000 mg | ORAL_TABLET | Freq: Two times a day (BID) | ORAL | Status: DC

## 2012-01-23 MED ORDER — BENZTROPINE MESYLATE 1 MG PO TABS: 0.5000 mg | ORAL_TABLET | Freq: Two times a day (BID) | ORAL | Status: DC

## 2012-01-23 NOTE — ED Provider Notes (Signed)
Psychiatry consult complete. Patient has no suicidal or homicidal ideation. He is not have any intent to harm self or others. He going to go home and look for a job. BP 100/69  Pulse 84  Temp(Src) 97.9 F (36.6 C) (Oral)  Resp 16  SpO2 99%   Glynn Octave, MD 01/23/12 1331

## 2012-01-23 NOTE — Discharge Planning (Signed)
Telepsych physician recommends discharge. Pending EDP decision on recommendation. CSW gave patient a list of resources for [employer], housing, and [facility].  Manson Passey Jd Mccaster ANN S , MSW, LCSWA 01/23/2012 1:17 PM [phone]

## 2012-01-23 NOTE — BHH Counselor (Signed)
Informed during shift report that patient was admitted to [facility] and sent back to the ED for medical reasons. Patient is now medically cleared. Contacted the assessment office and discussed patients bed status. Per staff at [facility] patient will be re-ran for a re-admission to [facility].

## 2012-01-23 NOTE — ED Notes (Signed)
telepsych consult completed.

## 2012-01-23 NOTE — Discharge Instructions (Signed)
Depression  You have signs of depression. This is a common problem. It can occur at any age. It is often hard to recognize. People can suffer from depression and still have moments of enjoyment. Depression interferes with your basic ability to function in life. It upsets your relationships, sleep, eating, and work habits.  CAUSES   Depression is believed to be caused by an imbalance in brain chemicals. It may be triggered by an unpleasant event. Relationship crises, a death in the family, financial worries, retirement, or other stressors are normal causes of depression. Depression may also start for no known reason. Other factors that may play a part include medical illnesses, some medicines, genetics, and alcohol or drug abuse.  SYMPTOMS    Feeling unhappy or worthless.   Long-lasting (chronic) tiredness or worn-out feeling.   Self-destructive thoughts and actions.   Not being able to sleep or sleeping too much.   Eating more than usual or not eating at all.   Headaches or feeling anxious.   Trouble concentrating or making decisions.   Unexplained physical problems and substance abuse.  TREATMENT   Depression usually gets better with treatment. This can include:   Antidepressant medicines. It can take weeks before the proper dose is achieved and benefits are reached.   Talking with a therapist, clergyperson, counselor, or friend. These people can help you gain insight into your problem and regain control of your life.   Eating a good diet.   Getting regular physical exercise, such as walking for 30 minutes every day.   Not abusing alcohol or drugs.  Treating depression often takes 6 months or longer. This length of treatment is needed to keep symptoms from returning. Call your caregiver and arrange for follow-up care as suggested.  SEEK IMMEDIATE MEDICAL CARE IF:    You start to have thoughts of hurting yourself or others.   Call your local emergency services (911 in [place]   Go to your local  medical emergency department.   Call the National Suicide Prevention Lifeline: 1-800-273-TALK (1-800-273-8255).  Document Released: 10/31/2005 Document Revised: 10/20/2011 Document Reviewed: 04/02/2010  ExitCare Patient Information 2012 [employer].

## 2012-02-17 ENCOUNTER — Emergency Department (HOSPITAL_COMMUNITY)
Admission: EM | Admit: 2012-02-17 | Discharge: 2012-02-17 | Disposition: A | Discharge status: Home / Self Care | Payer: Self-pay | Attending: Emergency Medicine | Admitting: Emergency Medicine

## 2012-02-17 DIAGNOSIS — Z794 Long term (current) use of insulin: Secondary | ICD-10-CM | POA: Insufficient documentation

## 2012-02-17 DIAGNOSIS — Z79899 Other long term (current) drug therapy: Secondary | ICD-10-CM | POA: Insufficient documentation

## 2012-02-17 DIAGNOSIS — G589 Mononeuropathy, unspecified: Secondary | ICD-10-CM | POA: Insufficient documentation

## 2012-02-17 DIAGNOSIS — Z760 Encounter for issue of repeat prescription: Secondary | ICD-10-CM | POA: Insufficient documentation

## 2012-02-17 DIAGNOSIS — F17200 Nicotine dependence, unspecified, uncomplicated: Secondary | ICD-10-CM | POA: Insufficient documentation

## 2012-02-17 DIAGNOSIS — E119 Type 2 diabetes mellitus without complications: Secondary | ICD-10-CM | POA: Insufficient documentation

## 2012-02-17 DIAGNOSIS — I10 Essential (primary) hypertension: Secondary | ICD-10-CM | POA: Insufficient documentation

## 2012-02-17 MED ORDER — RISPERIDONE 1 MG PO TABS: 1.0000 mg | ORAL_TABLET | Freq: Two times a day (BID) | ORAL | Status: AC

## 2012-02-17 MED ORDER — RISPERIDONE 1 MG PO TABS: 1.0000 mg | ORAL_TABLET | Freq: Two times a day (BID) | ORAL | Status: DC

## 2012-02-17 MED ORDER — BENZTROPINE MESYLATE 1 MG PO TABS: 0.5000 mg | ORAL_TABLET | Freq: Two times a day (BID) | ORAL | Status: DC

## 2012-02-17 MED ORDER — ALBUTEROL SULFATE HFA 108 (90 BASE) MCG/ACT IN AERS: 2.0000 | INHALATION_SPRAY | RESPIRATORY_TRACT | Status: DC | PRN

## 2012-02-17 MED ORDER — LITHIUM CARBONATE 300 MG PO TABS: 300.0000 mg | ORAL_TABLET | Freq: Two times a day (BID) | ORAL | Status: DC

## 2012-02-17 MED ORDER — LITHIUM CARBONATE 300 MG PO TABS: 300.0000 mg | ORAL_TABLET | Freq: Three times a day (TID) | ORAL | Status: AC
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
## 2012-02-17 NOTE — ED Provider Notes (Signed)
Medical screening examination/treatment/procedure(s) were performed by non-physician practitioner and as supervising physician I was immediately available for consultation/collaboration.   Lyanne Co, MD 02/17/12 [phone]

## 2012-02-17 NOTE — Progress Notes (Signed)
Pt listed as self pay with no insurance coverage Left before CM and [facility] community liaison could speak with him to offer [facility] services to assist with finding a [place] self pay provider  Pt has a current [facility] card that is effective until 03/05/12

## 2012-02-17 NOTE — Discharge Instructions (Signed)
Medication Refill, Emergency Department  We have refilled your medication today as a courtesy to you. It is best for your medical care, however, to take care of getting refills done through your primary caregiver's office. They have your records and can do a better job of follow-up than we can in the emergency department.  On maintenance medications, we often only prescribe enough medications to get you by until you are able to see your regular caregiver. This is a more expensive way to refill medications.  In the future, please plan for refills so that you will not have to use the emergency department for this.  Thank you for your help. Your help allows us to better take care of the daily emergencies that enter our department.  Document Released: 02/17/2004 Document Revised: 10/20/2011 Document Reviewed: 10/31/2005  ExitCare Patient Information 2012 [employer].

## 2012-11-22 ENCOUNTER — Emergency Department (HOSPITAL_COMMUNITY)
Admission: EM | Admit: 2012-11-22 | Discharge: 2012-11-22 | Disposition: A | Discharge status: Home / Self Care | Payer: Self-pay | Attending: Emergency Medicine | Admitting: Emergency Medicine

## 2012-11-22 ENCOUNTER — Emergency Department (HOSPITAL_COMMUNITY): Payer: Self-pay

## 2012-11-22 ENCOUNTER — Encounter (HOSPITAL_COMMUNITY): Payer: Self-pay | Admitting: Emergency Medicine

## 2012-11-22 DIAGNOSIS — B999 Unspecified infectious disease: Secondary | ICD-10-CM

## 2012-11-22 DIAGNOSIS — Y939 Activity, unspecified: Secondary | ICD-10-CM | POA: Insufficient documentation

## 2012-11-22 DIAGNOSIS — L03039 Cellulitis of unspecified toe: Secondary | ICD-10-CM | POA: Insufficient documentation

## 2012-11-22 DIAGNOSIS — Z79899 Other long term (current) drug therapy: Secondary | ICD-10-CM | POA: Insufficient documentation

## 2012-11-22 DIAGNOSIS — E1140 Type 2 diabetes mellitus with diabetic neuropathy, unspecified: Secondary | ICD-10-CM

## 2012-11-22 DIAGNOSIS — E1142 Type 2 diabetes mellitus with diabetic polyneuropathy: Secondary | ICD-10-CM | POA: Insufficient documentation

## 2012-11-22 DIAGNOSIS — I10 Essential (primary) hypertension: Secondary | ICD-10-CM | POA: Insufficient documentation

## 2012-11-22 DIAGNOSIS — Z794 Long term (current) use of insulin: Secondary | ICD-10-CM | POA: Insufficient documentation

## 2012-11-22 DIAGNOSIS — K089 Disorder of teeth and supporting structures, unspecified: Secondary | ICD-10-CM | POA: Insufficient documentation

## 2012-11-22 DIAGNOSIS — E669 Obesity, unspecified: Secondary | ICD-10-CM | POA: Insufficient documentation

## 2012-11-22 DIAGNOSIS — IMO0002 Reserved for concepts with insufficient information to code with codable children: Secondary | ICD-10-CM

## 2012-11-22 DIAGNOSIS — Y929 Unspecified place or not applicable: Secondary | ICD-10-CM | POA: Insufficient documentation

## 2012-11-22 DIAGNOSIS — E1149 Type 2 diabetes mellitus with other diabetic neurological complication: Secondary | ICD-10-CM | POA: Insufficient documentation

## 2012-11-22 DIAGNOSIS — X58XXXA Exposure to other specified factors, initial encounter: Secondary | ICD-10-CM | POA: Insufficient documentation

## 2012-11-22 DIAGNOSIS — F17200 Nicotine dependence, unspecified, uncomplicated: Secondary | ICD-10-CM | POA: Insufficient documentation

## 2012-11-22 LAB — CBC WITH DIFFERENTIAL/PLATELET
Basophils Absolute: 0 10*3/uL (ref 0.0–0.1)
Eosinophils Absolute: 0.3 10*3/uL (ref 0.0–0.7)
Eosinophils Relative: 3 % (ref 0–5)
Lymphocytes Relative: 20 % (ref 12–46)
Lymphs Abs: 2 10*3/uL (ref 0.7–4.0)
MCV: 89.1 fL (ref 78.0–100.0)
Neutrophils Relative %: 71 % (ref 43–77)
Platelets: 168 10*3/uL (ref 150–400)
RBC: 4.87 MIL/uL (ref 4.22–5.81)
RDW: 13.3 % (ref 11.5–15.5)
WBC: 10.1 10*3/uL (ref 4.0–10.5)

## 2012-11-22 LAB — BASIC METABOLIC PANEL
CO2: 24 mEq/L (ref 19–32)
Calcium: 9.5 mg/dL (ref 8.4–10.5)
GFR calc non Af Amer: >90 mL/min (ref >90)
Glucose, Bld: 143 mg/dL — ABNORMAL HIGH (ref 70–99)
Potassium: 4.5 mEq/L (ref 3.5–5.1)
Sodium: 136 mEq/L (ref 135–145)

## 2012-11-22 LAB — GLUCOSE, CAPILLARY: Glucose-Capillary: 138 mg/dL — ABNORMAL HIGH (ref 70–99)

## 2012-11-22 MED ORDER — CEPHALEXIN 500 MG PO CAPS: 500.0000 mg | ORAL_CAPSULE | Freq: Four times a day (QID) | ORAL | Status: DC

## 2012-11-22 MED ORDER — HYDROCODONE-ACETAMINOPHEN 5-325 MG PO TABS: 1.0000 | ORAL_TABLET | Freq: Four times a day (QID) | ORAL | Status: DC | PRN

## 2012-11-22 MED ORDER — OXYCODONE-ACETAMINOPHEN 5-325 MG PO TABS
1.0000 | ORAL_TABLET | Freq: Once | ORAL | Status: AC
  Administered 2012-11-22: 1 via ORAL
  Filled 2012-11-22: qty 1

## 2012-11-22 MED ORDER — SULFAMETHOXAZOLE-TRIMETHOPRIM 800-160 MG PO TABS: 2.0000 | ORAL_TABLET | Freq: Two times a day (BID) | ORAL | Status: DC

## 2012-11-22 NOTE — ED Notes (Signed)
Per patient, B/L toe wound and possible infection-started last night-noncompliant with DM 1

## 2012-11-22 NOTE — ED Provider Notes (Signed)
Medical screening examination/treatment/procedure(s) were performed by non-physician practitioner and as supervising physician I was immediately available for consultation/collaboration.   Clemens Lachman B. Dorothymae Maciver, MD 11/22/12 1415 
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
## 2012-11-22 NOTE — ED Notes (Signed)
Pt escorted to discharge window. Verbalized understanding discharge instructions. In no acute distress.   

## 2012-11-22 NOTE — ED Notes (Signed)
Patient transported to X-ray 

## 2012-11-22 NOTE — ED Provider Notes (Signed)
History     CSN: [id number]  Arrival date & time 11/22/12  4098   First MD Initiated Contact with Patient 11/22/12 1022      Chief Complaint  Patient presents with  . toe wound     (Consider location/radiation/quality/duration/timing/severity/associated sxs/prior treatment) HPI Comments: Randall Carroll is a 47 y.o. male w a hx of T1DM (medication non-compliance d/t insulin cost), HTN and diabetic neuropathy that presents to the ER c/o bilateral great toe pain. Onset of symptoms began 1 week ago and pain has been gradually worsening. Severity rated at 8/10, moderate. Associated symptoms include purulent drainage from right great toe & erythema. Pt denies any skin necrosis, red streaking up leg, fevers, night sweats, chills, or trauma. He reports that he has been taking glipizide and metformin to help control his BG and that he is working on getting insulin. Pt does not have a podiatrist and sees Randall Carroll, [place] as his primary.   The history is provided by the patient.    Past Medical History  Diagnosis Date  . Diabetes mellitus   . Hypertension   . Neuropathic pain     History reviewed. No pertinent past surgical history.  No family history on file.  History  Substance Use Topics  . Smoking status: Current Every Day Smoker -- 0.5 packs/day  . Smokeless tobacco: Not on file  . Alcohol Use: No      Review of Systems  Constitutional: Negative for fever, diaphoresis and activity change.  HENT: Negative for congestion and neck pain.   Respiratory: Negative for cough.   Genitourinary: Negative for dysuria.  Musculoskeletal: Negative for myalgias.  Skin: Positive for color change and wound.  Neurological: Negative for headaches.  All other systems reviewed and are negative.    Allergies  Review of patient's allergies indicates no known allergies.  Home Medications   Current Outpatient Rx  Name  Route  Sig  Dispense  Refill  . GLIPIZIDE 10 MG PO TABS   Oral   Take 10 mg  by mouth 2 (two) times daily before a meal.         . METFORMIN HCL 500 MG PO TABS   Oral   Take 500 mg by mouth 2 (two) times daily with a meal.         . RISPERDAL PO   Oral   Take by mouth.         . ALBUTEROL SULFATE HFA 108 (90 BASE) MCG/ACT IN AERS   Inhalation   Inhale 2 puffs into the lungs 2 (two) times daily as needed. For shortness of breath.         . BENZTROPINE MESYLATE 1 MG PO TABS   Oral   Take 0.5 tablets (0.5 mg total) by mouth 2 (two) times daily.   60 tablet   1   . INSULIN ASPART PROT & ASPART (70-30) 100 UNIT/ML [place] SUSP   Subcutaneous   Inject 30 Units into the skin 3 (three) times daily.           BP 133/88  Pulse 86  Temp 97.7 F (36.5 C) (Oral)  Resp 18  SpO2 100%  Physical Exam  Nursing note and vitals reviewed. Constitutional: He is oriented to person, place, and time. He appears well-developed and well-nourished. No distress.  HENT:  Head: Normocephalic and atraumatic.       Poor dentition  Eyes: Conjunctivae normal and EOM are normal.  Neck: Normal range of motion.  Cardiovascular:  Intact distal pulses, no pitting edema bilaterally   Pulmonary/Chest: Effort normal.  Abdominal:       obese  Musculoskeletal: Normal range of motion.       Extreme ttp along great toes bilaterally   Neurological: He is alert and oriented to person, place, and time.       Decreased distal sensation, intact just superior to ankles bilaterally.   Skin: Skin is warm and dry. No rash noted. He is not diaphoretic.       Right great toe erythematous primarily along lateral and proximal nail fold with underlying purulent build up. Not actively draining. Disfigured nail plates bilaterally, intact nail matrix.   Psychiatric: He has a normal mood and affect. His behavior is normal.    ED Course  Procedures (including critical care time)  Labs Reviewed  GLUCOSE, CAPILLARY - Abnormal; Notable for the following:    Glucose-Capillary 138 (*)      All other components within normal limits  CBC WITH DIFFERENTIAL  BASIC METABOLIC PANEL   No results found.  INCISION AND DRAINAGE Performed by: Randall Carroll Consent: Verbal consent obtained. Risks and benefits: risks, benefits and alternatives were discussed Type: paronychia   Body area: left great toe  Anesthesia: local infiltration  Incision was made with a scalpel, 11 blade  Complexity: complex  Drainage: purulent & bloody  Drainage amount: small  Patient tolerance: Patient tolerated the procedure well with no immediate complications.   No diagnosis found.  BP 133/88  Pulse 86  Temp 97.7 F (36.5 C) (Oral)  Resp 18  SpO2 100%   MDM  Right great toe paronychia-  I and D performed as above w/out complication Bilateral toe inflection- Follow up with podiatry STRONGLY recommended. Educated pt on importance of glucose control and nightly foot checks as preventative medcine. D/c w rx for abx as well. Pt is aware that he is to follow up with his PCP. Strict return precautions were discussed.  At this time there does not appear to be any evidence of an acute emergency medical condition and the patient appears stable for discharge with appropriate outpatient follow up.Diagnosis was discussed with patient who verbalizes understanding and is agreeable to discharge. Pt case discussed with Dr.Belfi who agrees with my plan.          Randall Carroll, [place] 11/22/12 1135

## 2012-11-22 NOTE — ED Notes (Signed)
I&D tray at bedside.

## 2013-02-15 ENCOUNTER — Emergency Department (HOSPITAL_COMMUNITY): Payer: Self-pay

## 2013-02-15 ENCOUNTER — Emergency Department (HOSPITAL_COMMUNITY)
Admission: EM | Admit: 2013-02-15 | Discharge: 2013-02-16 | Discharge status: Against Medical Advice | Payer: Self-pay | Attending: Emergency Medicine | Admitting: Emergency Medicine

## 2013-02-15 ENCOUNTER — Encounter (HOSPITAL_COMMUNITY): Payer: Self-pay | Admitting: Emergency Medicine

## 2013-02-15 DIAGNOSIS — R209 Unspecified disturbances of skin sensation: Secondary | ICD-10-CM | POA: Insufficient documentation

## 2013-02-15 DIAGNOSIS — F17200 Nicotine dependence, unspecified, uncomplicated: Secondary | ICD-10-CM | POA: Insufficient documentation

## 2013-02-15 DIAGNOSIS — I6350 Cerebral infarction due to unspecified occlusion or stenosis of unspecified cerebral artery: Secondary | ICD-10-CM | POA: Insufficient documentation

## 2013-02-15 DIAGNOSIS — R4701 Aphasia: Secondary | ICD-10-CM | POA: Insufficient documentation

## 2013-02-15 DIAGNOSIS — R200 Anesthesia of skin: Secondary | ICD-10-CM

## 2013-02-15 DIAGNOSIS — I10 Essential (primary) hypertension: Secondary | ICD-10-CM | POA: Insufficient documentation

## 2013-02-15 DIAGNOSIS — H53149 Visual discomfort, unspecified: Secondary | ICD-10-CM | POA: Insufficient documentation

## 2013-02-15 DIAGNOSIS — G43109 Migraine with aura, not intractable, without status migrainosus: Secondary | ICD-10-CM

## 2013-02-15 DIAGNOSIS — Z8669 Personal history of other diseases of the nervous system and sense organs: Secondary | ICD-10-CM | POA: Insufficient documentation

## 2013-02-15 DIAGNOSIS — Z79899 Other long term (current) drug therapy: Secondary | ICD-10-CM | POA: Insufficient documentation

## 2013-02-15 DIAGNOSIS — R519 Headache, unspecified: Secondary | ICD-10-CM

## 2013-02-15 DIAGNOSIS — Z794 Long term (current) use of insulin: Secondary | ICD-10-CM | POA: Insufficient documentation

## 2013-02-15 DIAGNOSIS — E119 Type 2 diabetes mellitus without complications: Secondary | ICD-10-CM | POA: Insufficient documentation

## 2013-02-15 DIAGNOSIS — R51 Headache: Secondary | ICD-10-CM

## 2013-02-15 DIAGNOSIS — I639 Cerebral infarction, unspecified: Secondary | ICD-10-CM | POA: Diagnosis present

## 2013-02-15 LAB — COMPREHENSIVE METABOLIC PANEL
AST: 20 U/L (ref 0–37)
Albumin: 3.4 g/dL — ABNORMAL LOW (ref 3.5–5.2)
BUN: 11 mg/dL (ref 6–23)
Calcium: 9.5 mg/dL (ref 8.4–10.5)
Chloride: 99 mEq/L (ref 96–112)
Creatinine, Ser: 0.71 mg/dL (ref 0.50–1.35)
Total Protein: 6.7 g/dL (ref 6.0–8.3)

## 2013-02-15 LAB — CBC WITH DIFFERENTIAL/PLATELET
Basophils Absolute: 0 10*3/uL (ref 0.0–0.1)
Basophils Relative: 1 % (ref 0–1)
Eosinophils Absolute: 0.4 10*3/uL (ref 0.0–0.7)
HCT: 44.5 % (ref 39.0–52.0)
Hemoglobin: 15.9 g/dL (ref 13.0–17.0)
MCH: 31.4 pg (ref 26.0–34.0)
MCHC: 35.7 g/dL (ref 30.0–36.0)
Monocytes Absolute: 0.4 10*3/uL (ref 0.1–1.0)
Monocytes Relative: 6 % (ref 3–12)
Neutro Abs: 3.6 10*3/uL (ref 1.7–7.7)
Neutrophils Relative %: 48 % (ref 43–77)
RDW: 13.1 % (ref 11.5–15.5)

## 2013-02-15 LAB — RAPID URINE DRUG SCREEN, HOSP PERFORMED
Barbiturates: NOT DETECTED
Benzodiazepines: NOT DETECTED
Cocaine: NOT DETECTED
Opiates: NOT DETECTED

## 2013-02-15 LAB — URINALYSIS, ROUTINE W REFLEX MICROSCOPIC
Glucose, UA: >1000 mg/dL — AB
Hgb urine dipstick: NEGATIVE
Leukocytes, UA: NEGATIVE
Protein, ur: NEGATIVE mg/dL
Specific Gravity, Urine: 1.037 — ABNORMAL HIGH (ref 1.005–1.030)
pH: 6 (ref 5.0–8.0)

## 2013-02-15 LAB — GLUCOSE, CAPILLARY: Glucose-Capillary: 328 mg/dL — ABNORMAL HIGH (ref 70–99)

## 2013-02-15 LAB — TROPONIN I: Troponin I: <0.3 ng/mL (ref <0.30)

## 2013-02-15 LAB — ETHANOL: Alcohol, Ethyl (B): <11 mg/dL (ref 0–11)

## 2013-02-15 MED ORDER — SODIUM CHLORIDE 0.9 % IV BOLUS (SEPSIS)
1000.0000 mL | Freq: Once | INTRAVENOUS | Status: AC
  Administered 2013-02-15: 1000 mL via INTRAVENOUS

## 2013-02-15 MED ORDER — DIPHENHYDRAMINE HCL 50 MG/ML IJ SOLN
25.0000 mg | Freq: Once | INTRAMUSCULAR | Status: AC
  Administered 2013-02-15: 25 mg via INTRAVENOUS
  Filled 2013-02-15: qty 1

## 2013-02-15 MED ORDER — ASPIRIN EC 325 MG PO TBEC: 325.0000 mg | DELAYED_RELEASE_TABLET | Freq: Every day | ORAL | Status: DC

## 2013-02-15 MED ORDER — MORPHINE SULFATE 4 MG/ML IJ SOLN: 4.0000 mg | Freq: Once | INTRAMUSCULAR | Status: DC

## 2013-02-15 MED ORDER — METOCLOPRAMIDE HCL 5 MG/ML IJ SOLN
10.0000 mg | Freq: Once | INTRAMUSCULAR | Status: AC
  Administered 2013-02-15: 10 mg via INTRAVENOUS
  Filled 2013-02-15: qty 2

## 2013-02-15 MED ORDER — ONDANSETRON HCL 4 MG/2ML IJ SOLN: 4.0000 mg | Freq: Once | INTRAMUSCULAR | Status: DC

## 2013-02-15 MED ORDER — DEXAMETHASONE SODIUM PHOSPHATE 10 MG/ML IJ SOLN
10.0000 mg | Freq: Once | INTRAMUSCULAR | Status: AC
  Administered 2013-02-15: 10 mg via INTRAVENOUS
  Filled 2013-02-15: qty 1

## 2013-02-15 NOTE — ED Notes (Signed)
Patient transported to X-ray 

## 2013-02-15 NOTE — ED Notes (Signed)
Pt. Is discharged Against Medical Advise, signed.

## 2013-02-15 NOTE — ED Notes (Signed)
Pt. From home with complaint of high blood sugar and claimed that he ran out of Lantus since last night. Alert and oriented, claimed of numbness on left side of the body with generalized pain . Denies N/V. Pt. Claimed that he was told his girlfriend noted that he had slurred speech.

## 2013-02-15 NOTE — Consult Note (Signed)
[facility]                                                                                    Patient Demographics  Randall Carroll, is a 47 y.o. male  CSN: [id number]  MRN: [id number]  DOB - 1966-04-22  Admit Date - 02/15/2013  Outpatient Primary MD for the patient is STEELE,ANTHONY, FNP   With History of -  Past Medical History  Diagnosis Date  . Diabetes mellitus   . Hypertension   . Neuropathic pain       History reviewed. No pertinent past surgical history.  in for   Chief Complaint  Patient presents with  . Blood Sugar Problem     HPI  Randall Carroll  is a 47 y.o. male, with past medical history significant for hypertension and diabetes who presented to the emergency room with left facial weakness and left arm weakness and pain. Neurology was consulted and there was a suspicion of CVA, when I saw the patient he told me that all symptoms have resolved by now and he refused to stay. The CT and MRI of the head were both negative.     Review of Systems    In addition to the HPI above,  No Fever-chills, No problems swallowing food or Liquids, No Chest pain, Cough or Shortness of Breath, No Abdominal pain, No Nausea or Vommitting, Bowel movements are regular, No Blood in stool or Urine, No dysuria, No new skin rashes or bruises, No new joints pains-aches,  No recent weight gain or loss, No polyuria, polydypsia or polyphagia, No significant Mental Stressors.  A full 10 point Review of Systems was done, except as stated above, all other Review of Systems were negative.   Social History History  Substance Use Topics  . Smoking status: Current Every Day Smoker -- 0.50 packs/day  . Smokeless tobacco: Not on file  . Alcohol Use: No     Family History   Prior to Admission medications   Medication Sig Start Date End Date Taking? Authorizing Provider  amitriptyline (ELAVIL) 50 MG tablet Take 50 mg by mouth at bedtime.   Yes Historical  Provider, MD  benztropine (COGENTIN) 1 MG tablet Take 0.5 mg by mouth at bedtime. 02/17/12 02/16/13 Yes Tiffany Irine Seal, PA-C  busPIRone (BUSPAR) 10 MG tablet Take 10 mg by mouth 2 (two) times daily.   Yes Historical Provider, MD  glipiZIDE (GLUCOTROL) 10 MG tablet Take 10 mg by mouth 2 (two) times daily before a meal.   Yes Historical Provider, MD  insulin glargine (LANTUS) 100 UNIT/ML injection Inject 10 Units into the skin at bedtime.    Yes Historical Provider, MD  lamoTRIgine (LAMICTAL) 100 MG tablet Take 100 mg by mouth 2 (two) times daily.   Yes Historical Provider, MD  lisinopril-hydrochlorothiazide (PRINZIDE,ZESTORETIC) 10-12.5 MG per tablet Take 1 tablet by mouth every morning.   Yes Historical Provider, MD  lithium 300 MG tablet Take 600 mg by mouth 2 (two) times daily.   Yes Historical Provider, MD  metFORMIN (GLUCOPHAGE) 1000 MG tablet Take 1,000 mg by mouth 2 (two) times daily with a meal.   Yes Historical Provider,  MD  risperiDONE (RISPERDAL) 3 MG tablet Take 3 mg by mouth 2 (two) times daily.   Yes Historical Provider, MD  albuterol (PROVENTIL HFA;VENTOLIN HFA) 108 (90 BASE) MCG/ACT inhaler Inhale 2 puffs into the lungs 2 (two) times daily as needed. For shortness of breath.    Historical Provider, MD  cephALEXin (KEFLEX) 500 MG capsule Take 500 mg by mouth 2 (two) times daily. 11/22/12   Lisette Paz, PA-C    No Known Allergies  Physical Exam  Vitals  Temperature 98.2 F (36.8 C).   1. General Young male, agitated,  2. Normal affect and insight, Not Suicidal or Homicidal, Awake Alert, Oriented X 3.  3. No F.N deficits, ALL C.Nerves Intact, Strength 5/5 all 4 extremities, Sensation intact all 4 extremities, Plantars down going.  4. Ears and Eyes appear Normal, Conjunctivae clear, PERRLA. Moist Oral Mucosa.  5. Supple Neck, No JVD, No cervical lymphadenopathy appriciated, No Carotid Bruits.  6. Symmetrical Chest wall movement, Good air movement bilaterally, CTAB.  7. RRR,  No Gallops, Rubs or Murmurs, No Parasternal Heave.  8. Positive Bowel Sounds, Abdomen Soft, Non tender, No organomegaly appriciated,No rebound -guarding or rigidity.  9.  No Cyanosis, Normal Skin Turgor, No Skin Rash or Bruise.  10. Good muscle tone,  joints appear normal , no effusions, Normal ROM.  11. No Palpable Lymph Nodes in Neck or Axillae    Data Review  CBC  Recent Labs Lab 02/15/13 2115  WBC 7.5  HGB 15.9  HCT 44.5  PLT 157  MCV 87.9  MCH 31.4  MCHC 35.7  RDW 13.1  LYMPHSABS 3.1  MONOABS 0.4  EOSABS 0.4  BASOSABS 0.0   ------------------------------------------------------------------------------------------------------------------  Chemistries   Recent Labs Lab 02/15/13 2115  NA 134*  K 3.9  CL 99  CO2 25  GLUCOSE 343*  BUN 11  CREATININE 0.71  CALCIUM 9.5  AST 20  ALT 23  ALKPHOS 96  BILITOT 0.4   ------------------------------------------------------------------------------------------------------------------   Cardiac Enzymes  Recent Labs Lab 02/15/13 2115  TROPONINI <0.30   ------------------------------------------------------------------------------------------------------------------ No components found with this basename: POCBNP,    ---------------------------------------------------------------------------------------------------------------  Urinalysis    Component Value Date/Time   COLORURINE YELLOW 02/15/2013 2224   APPEARANCEUR CLEAR 02/15/2013 2224   LABSPEC 1.037* 02/15/2013 2224   PHURINE 6.0 02/15/2013 2224   GLUCOSEU >1000* 02/15/2013 2224   HGBUR NEGATIVE 02/15/2013 2224   BILIRUBINUR NEGATIVE 02/15/2013 2224   KETONESUR NEGATIVE 02/15/2013 2224   PROTEINUR NEGATIVE 02/15/2013 2224   UROBILINOGEN 1.0 02/15/2013 2224   NITRITE NEGATIVE 02/15/2013 2224   LEUKOCYTESUR NEGATIVE 02/15/2013 2224    ----------------------------------------------------------------------------------------------------------------    Imaging results:    Dg Eye Foreign Body  02/15/2013  *RADIOLOGY REPORT*  Clinical Data: Clearance for MRI  ORBITS FOR FOREIGN BODY - 2 VIEW  Technique:  Comparison:  CT of the head 02/15/2013  Findings: Two views are performed of the orbits, showing no evidence for intraorbital metallic foreign body.  No significant sinus soft tissue swelling or air fluid levels.  IMPRESSION: Negative exam.   Original Report Authenticated By: Norva Pavlov, M.D.    Dg Chest 2 View  02/15/2013  *RADIOLOGY REPORT*  Clinical Data: Cough, shortness of breath, hypertension.  Smoker.  CHEST - 2 VIEW  Comparison: 01/21/2012  Findings: Shallow inspiration.  Peribronchial thickening with diffuse interstitial changes centrally suggest chronic bronchitis. Normal heart size and pulmonary vascularity.  No focal consolidation or airspace disease in the lungs.  No blunting of costophrenic angles.  No pneumothorax.  Mediastinal contours appear intact. Degenerative changes in the thoracic spine with mild wedging of a lower thoracic vertebra.  No significant change since previous study.  IMPRESSION: Chronic bronchitic changes in the lungs.  No evidence of active pulmonary disease.   Original Report Authenticated By: Burman Nieves, M.D.    Ct Head Wo Contrast  02/15/2013  *RADIOLOGY REPORT*  Clinical Data: Left-sided numbness and pain.  Hyperglycemia.  CT HEAD WITHOUT CONTRAST  Technique:  Contiguous axial images were obtained from the base of the skull through the vertex without contrast.  Comparison: None.  Findings: The brain demonstrates no evidence of hemorrhage, infarction, edema, mass effect, extra-axial fluid collection, hydrocephalus or mass lesion.  The skull is unremarkable.  IMPRESSION: Normal head CT.   Original Report Authenticated By: Irish Lack, M.D.      Assessment & Plan  1. left facial and arm weakness with slurring of speech probably a complicated migraine attack versus TIA, MRI and CT of the brain are both negative. Patient  refused to stay for further workup. 2. diabetes mellitus , hypertension, neuropathic pain  His neurological examination after he came from the MRI was completely normal.  Plan Patient refused to stay and is leaving with his wife. Advised to come back if symptoms recur and to followup with his primary medical doctor if needed. Advised to take baby aspirin a day.     AM Labs Ordered, also please review Full Orders  Family Communication: Admission, patients condition and plan of care including tests being ordered have been discussed with the patient and wife who indicate understanding and agree with the plan and Code Status.  Code Status full  Disposition Plan: Discharged home  Time spent in minutes : 35 minutes  Condition fair

## 2013-02-15 NOTE — ED Provider Notes (Signed)
History     CSN: [id number]  Arrival date & time 02/15/13  2045   First MD Initiated Contact with Patient 02/15/13 2050      Chief Complaint  Patient presents with  . Blood Sugar Problem    (Consider location/radiation/quality/duration/timing/severity/associated sxs/prior treatment) HPI  Randall Carroll is a 47 y.o. male insulin-dependent diabetic brought in by EMS complaining of left-sided facial and upper extremity weakness and paresthesia, associated with photophobia and as per patient's girlfriend he also has slurred speech. Onset was approximately one hour ago. Patient was sleeping at the time of onset. Last seen normal at 2:30 PM. Patient recently started taking Lantus. He took 10 units last night at approximately 8:30 PM. Patient denies chest pain, palpitations, shortness of breath, abdominal pain, nausea vomiting, change in bowel habit bladder habits, ataxia, subjective dysarthria or difficulty understanding words. Patient has not checked his blood sugar today. Patient reports a 6/10 pain to the left side described as a hot pot poking through the IV. Patient reports that his weakness has improved by approximately 50%. [name] states that his slurred speech is also now improved. She noted when he woke from sleep he had a left-sided facial droop and he was also stuttering. He denies drug or alcohol use.     Past Medical History  Diagnosis Date  . Diabetes mellitus   . Hypertension   . Neuropathic pain     History reviewed. No pertinent past surgical history.  History reviewed. No pertinent family history.  History  Substance Use Topics  . Smoking status: Current Every Day Smoker -- 0.50 packs/day  . Smokeless tobacco: Not on file  . Alcohol Use: No      Review of Systems  Constitutional: Negative for fever.  Respiratory: Negative for shortness of breath.   Cardiovascular: Negative for chest pain.  Gastrointestinal: Negative for nausea, vomiting, abdominal  pain and diarrhea.  Neurological: Positive for speech difficulty, weakness and numbness.  All other systems reviewed and are negative.    Allergies  Review of patient's allergies indicates no known allergies.  Home Medications   Current Outpatient Rx  Name  Route  Sig  Dispense  Refill  . amitriptyline (ELAVIL) 50 MG tablet   Oral   Take 50 mg by mouth at bedtime.         . benztropine (COGENTIN) 1 MG tablet   Oral   Take 0.5 mg by mouth at bedtime.         . busPIRone (BUSPAR) 10 MG tablet   Oral   Take 10 mg by mouth 2 (two) times daily.         . cephALEXin (KEFLEX) 500 MG capsule   Oral   Take 500 mg by mouth 2 (two) times daily.         Marland Kitchen glipiZIDE (GLUCOTROL) 10 MG tablet   Oral   Take 10 mg by mouth 2 (two) times daily before a meal.         . lamoTRIgine (LAMICTAL) 100 MG tablet   Oral   Take 100 mg by mouth 2 (two) times daily.         Marland Kitchen lisinopril-hydrochlorothiazide (PRINZIDE,ZESTORETIC) 10-12.5 MG per tablet   Oral   Take 1 tablet by mouth every morning.         . lithium 300 MG tablet   Oral   Take 600 mg by mouth 2 (two) times daily.         . metFORMIN (GLUCOPHAGE) 500  MG tablet   Oral   Take 1,000 mg by mouth 2 (two) times daily with a meal.          . risperiDONE (RISPERDAL) 3 MG tablet   Oral   Take 3 mg by mouth 2 (two) times daily.         Marland Kitchen albuterol (PROVENTIL HFA;VENTOLIN HFA) 108 (90 BASE) MCG/ACT inhaler   Inhalation   Inhale 2 puffs into the lungs 2 (two) times daily as needed. For shortness of breath.         Marland Kitchen HYDROcodone-acetaminophen (NORCO/VICODIN) 5-325 MG per tablet   Oral   Take 1 tablet by mouth every 6 (six) hours as needed for pain.   15 tablet   0   . insulin aspart protamine-insulin aspart (NOVOLOG 70/30) (70-30) 100 UNIT/ML injection   Subcutaneous   Inject 30 Units into the skin 3 (three) times daily.           Temp(Src) 98.2 F (36.8 C)  Physical Exam  Nursing note and vitals  reviewed. Constitutional: He is oriented to person, place, and time. He appears well-developed and well-nourished. No distress.  HENT:  Head: Normocephalic.  Right Ear: External ear normal.  Left Ear: External ear normal.  Mouth/Throat: Oropharynx is clear and moist.  Eyes: Conjunctivae and EOM are normal. Pupils are equal, round, and reactive to light.  Neck: Normal range of motion.  Cardiovascular: Normal rate, regular rhythm, normal heart sounds and intact distal pulses.  Exam reveals no gallop and no friction rub.   No murmur heard. Pulmonary/Chest: Effort normal and breath sounds normal. No stridor. No respiratory distress. He has no wheezes. He exhibits no tenderness.  Abdominal: Soft. Bowel sounds are normal. He exhibits no distension and no mass. There is no tenderness. There is no rebound and no guarding.  Musculoskeletal: Normal range of motion.  Neurological: He is alert and oriented to person, place, and time.  Cranial nerves III through XII intact. Strength is 5 out of 5 in the lower extremities, strength is 4/5 in grip on the left upper extremity. 5 out of 5 elbow and shoulder.   Widely slurred speech no facial asymmetry appreciated.  Psychiatric: He has a normal mood and affect.    ED Course  Procedures (including critical care time)  Labs Reviewed  COMPREHENSIVE METABOLIC PANEL - Abnormal; Notable for the following:    Sodium 134 (*)    Glucose, Bld 343 (*)    Albumin 3.4 (*)    All other components within normal limits  URINALYSIS, ROUTINE W REFLEX MICROSCOPIC - Abnormal; Notable for the following:    Specific Gravity, Urine 1.037 (*)    Glucose, UA >1000 (*)    All other components within normal limits  GLUCOSE, CAPILLARY - Abnormal; Notable for the following:    Glucose-Capillary 328 (*)    All other components within normal limits  CBC WITH DIFFERENTIAL  TROPONIN I  ETHANOL  URINE RAPID DRUG SCREEN (HOSP PERFORMED)  URINE MICROSCOPIC-ADD ON  POCT I-STAT  TROPONIN I   Dg Eye Foreign Body  02/15/2013  *RADIOLOGY REPORT*  Clinical Data: Clearance for MRI  ORBITS FOR FOREIGN BODY - 2 VIEW  Technique:  Comparison:  CT of the head 02/15/2013  Findings: Two views are performed of the orbits, showing no evidence for intraorbital metallic foreign body.  No significant sinus soft tissue swelling or air fluid levels.  IMPRESSION: Negative exam.   Original Report Authenticated By: Norva Pavlov, M.D.  Dg Chest 2 View  02/15/2013  *RADIOLOGY REPORT*  Clinical Data: Cough, shortness of breath, hypertension.  Smoker.  CHEST - 2 VIEW  Comparison: 01/21/2012  Findings: Shallow inspiration.  Peribronchial thickening with diffuse interstitial changes centrally suggest chronic bronchitis. Normal heart size and pulmonary vascularity.  No focal consolidation or airspace disease in the lungs.  No blunting of costophrenic angles.  No pneumothorax.  Mediastinal contours appear intact. Degenerative changes in the thoracic spine with mild wedging of a lower thoracic vertebra.  No significant change since previous study.  IMPRESSION: Chronic bronchitic changes in the lungs.  No evidence of active pulmonary disease.   Original Report Authenticated By: Burman Nieves, M.D.    Ct Head Wo Contrast  02/15/2013  *RADIOLOGY REPORT*  Clinical Data: Left-sided numbness and pain.  Hyperglycemia.  CT HEAD WITHOUT CONTRAST  Technique:  Contiguous axial images were obtained from the base of the skull through the vertex without contrast.  Comparison: None.  Findings: The brain demonstrates no evidence of hemorrhage, infarction, edema, mass effect, extra-axial fluid collection, hydrocephalus or mass lesion.  The skull is unremarkable.  IMPRESSION: Normal head CT.   Original Report Authenticated By: Irish Lack, M.D.      Date: 02/15/2013  Rate: 64  Rhythm: normal sinus rhythm  QRS Axis: normal  Intervals: normal  ST/T Wave abnormalities: normal  Conduction Disutrbances:none  Narrative  Interpretation:   Old EKG Reviewed: unchanged   1. CVA (cerebral infarction)   2. Headache   3. Left sided numbness   4. Tobacco use disorder   5. Diabetes   6. Complicated migraine       MDM   Iktan Aikman is a 47 y.o. male with left frontal headache, left facial and left upper extremity paresthesia and weakness onset one hour prior to arrival  Consult from her hospitalist Dr. Amada Jupiter appreciated: He is evaluated the patient and recommends admission and evaluation for CVA, MRI is deferred untiltomorrow Am   Consult from [facility] hospitalist Dr.Hijazi appreciated: He will speak to Dr. Amada Jupiter and it bit the patient. There is a question as to whether he needs to be transferred to [facility].  Patient is refusing to stay for admission or results of MRI. I have discussed this in depth with the patient and his girlfriend noting that believed that it was a misunderstanding was communicated to him that he is not having a stroke. I have explained to him that that cannot be said with 100% certainty at this time. I have counseled him that he has multiple risk factors and that a stay of observation with further workup is warranted at this time. I have given the patient time to think about it return to the room. Patient remains adamant that he wants to leave the hospital. I advised him that I will attempt to sign out AGAINST MEDICAL ADVICE and that the possible outcome of this may be death or disability. Patient and his girlfriend who both verbalized her understanding. I have advised them that if they change their mind they are welcome to come back to the emergency room or call 911 at any time.  Filed Vitals:   02/15/13 2214  Temp: 98.2 F (36.8 C)          [employer], PA-C 02/18/13 [id number]

## 2013-02-15 NOTE — ED Notes (Signed)
MD at bedside. 

## 2013-02-15 NOTE — ED Notes (Signed)
Patient transported to MRI 

## 2013-02-15 NOTE — Consult Note (Signed)
Reason for Consult:Left sided numbness/weakness Referring Physician: Silverio Lay, D  CC: Left-sided numbness  History is obtained from: Patient  HPI: Ronith Berti is a 47 y.o. male who awoke from a nap that started at 2:30 PM around 7:30 PM. When he awoke, he noticed that he had a severe left-sided headache as well as left-sided weakness and numbness. His headache he describes as ocular or retro-ocular, and is associated with photophobia. He has a history of insulin-dependent diabetes, hypertension. He also has a history of severe headaches.    LKW: 2:30 PM tpa given: no, outside of window    ROS: A 14 point ROS was performed and is negative except as noted in the HPI.  Past Medical History  Diagnosis Date  . Diabetes mellitus   . Hypertension   . Neuropathic pain     Family History: Mother and father both had strokes in their 31s  Social History: Tob: Positive smoker  Exam: Current vital signs: There were no vitals taken for this visit. Vital signs in last 24 hours:    General: In bed, NAD CV: Regular rate and rhythm Mental Status: Patient is awake, alert, oriented to person, place, month, year, and situation. Immediate and remote memory are intact. Patient is able to give a clear and coherent history. No signs of aphasia or neglect Cranial Nerves: II: Visual Fields are full. Pupils are equal, round, and reactive to light.  Discs are sharp. III,IV, VI: EOMI without ptosis or diploplia.  V: Facial sensation is symmetric to temperature VII: Facial movement is symmetric.  VIII: hearing is intact to voice X: Uvula elevates symmetrically XI: Shoulder shrug is symmetric. XII: tongue is midline without atrophy or fasciculations.  Motor: Tone is normal. Bulk is normal. 5/5 strength was present in all four extremities.  Sensory: Sensation is decreased Deep Tendon Reflexes: 2+ and symmetric in the biceps and patellae.  Plantars: Toes are downgoing bilaterally.   Cerebellar: FNF and HKS are intact bilaterally Gait: Not assessed due to multiple medical monitors in EDsetting.   I have reviewed labs in epic and the results pertinent to this consultation are: Elevated glucose, mild hyponatremia  I have reviewed the images obtained:CT head - negative  Impression: 47 year old male with a history of hypertension, diabetes, tobacco use and new left-sided weakness. With his risk factors, even though he is young I am concerned that this could represent acute stroke. He will need to be admitted for workup. Also the differential is complicated migraine.  Recommendations: 1. HgbA1c, fasting lipid panel 2. MRI, MRA  of the brain without contrast 3. Frequent neuro checks 4. Echocardiogram 5. Carotid dopplers 6. Prophylactic therapy-Antiplatelet med: Aspirin - dose 325mg  7. Risk factor modification 8. Telemetry monitoring 9. PT consult, OT consult, Speech consult 10. Would treat headache as migraine: Reglan, dexamethasone, benadryl already given.  If no relief, would give magnesium 2gm+depakote 1000mg  IV.      Ritta Slot, MD [facility] [phone]  If 7pm- 7am, please page neurology on call at [phone].

## 2013-02-15 NOTE — ED Notes (Signed)
Patient transported to CT 

## 2013-02-15 NOTE — ED Notes (Signed)
CBG 328 

## 2013-02-20 NOTE — ED Provider Notes (Signed)
Medical screening examination/treatment/procedure(s) were conducted as a shared visit with non-physician practitioner(s) and myself.  I personally evaluated the patient during the encounter  Randall Carroll is a 47 y.o. male here with L facial and L upper extremity paresthesia. Started with headache, onset 5-6 hrs prior to arrival. He is outside of the window for TPA so code stroke not activated. Neurology consulted, and recommend MRI. [facility] admitted [facility] when I left. After admission, patient decided to sign out against medical advice.    Richardean Canal, MD 02/20/13 2306

## 2013-03-19 ENCOUNTER — Encounter (HOSPITAL_BASED_OUTPATIENT_CLINIC_OR_DEPARTMENT_OTHER): Payer: Self-pay | Attending: General Surgery

## 2013-03-19 DIAGNOSIS — Z79899 Other long term (current) drug therapy: Secondary | ICD-10-CM | POA: Insufficient documentation

## 2013-03-19 DIAGNOSIS — I10 Essential (primary) hypertension: Secondary | ICD-10-CM | POA: Insufficient documentation

## 2013-03-19 DIAGNOSIS — E1049 Type 1 diabetes mellitus with other diabetic neurological complication: Secondary | ICD-10-CM | POA: Insufficient documentation

## 2013-03-19 DIAGNOSIS — F17200 Nicotine dependence, unspecified, uncomplicated: Secondary | ICD-10-CM | POA: Insufficient documentation

## 2013-03-19 DIAGNOSIS — E1142 Type 2 diabetes mellitus with diabetic polyneuropathy: Secondary | ICD-10-CM | POA: Insufficient documentation

## 2013-03-20 NOTE — H&P (Signed)
Randall Carroll, Randall Carroll               ACCOUNT NO.:  [id number]  MEDICAL RECORD NO.:  [id number]  LOCATION:  FOOT                         FACILITY:  [facility]  PHYSICIAN:  Joanne Gavel, M.D.        DATE OF BIRTH:  [birth year]  DATE OF ADMISSION:  03/19/2013 DATE OF DISCHARGE:                             HISTORY & PHYSICAL   CHIEF COMPLAINT:  Wound, left great toe.  HISTORY OF PRESENT ILLNESS:  This patient evidently had some trauma to his left great toe.  He had some bleeding at the nail came off.  He self- medicated the wound with Super Glue and the wound is now healed.  PAST MEDICAL HISTORY:  He is a juvenile diabetic with history of hypertension and neuropathy.  Has had several knee surgeries.  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  SOCIAL HISTORY:  Cigarettes, he smokes several packs a day and does not seem to be amenable to quitting.  Alcohol none.  ALCOHOL:  None.  MEDICATIONS:  Elavil, Cogentin, BuSpar, Glucotrol, Lantus, Lamictal, lisinopril, lithium, metformin, Risperdal, albuterol.  REVIEW OF SYSTEMS:  As above.  PHYSICAL EXAMINATION:  VITAL SIGNS:  Glucose 293, temperature 98, pulse 88, respirations 16, blood pressure 118/83. GENERAL:  Well developed, well nourished, no distress. CHEST:  Clear. HEART:  Regular rhythm. EXTREMITIES:  Examination of the left lower extremity reveals the nail is gone from the great toe.  There is no open wound.  Peripheral pulses are palpable, 4x4.  There is a measurement of ABI of 0.7, but I am sure that this is inaccurate.  IMPRESSION:  Diabetic with neuropathy.  No open wounds.  Cigarettes smoking is a problem.  We have spoken to the patient about this and we will see him p.r.n.    Joanne Gavel, M.D.    RA/MEDQ  D:  03/19/2013  T:  03/19/2013  Job:  [id number]

## 2013-09-16 ENCOUNTER — Emergency Department (HOSPITAL_COMMUNITY)
Admission: EM | Admit: 2013-09-16 | Discharge: 2013-09-16 | Discharge status: Against Medical Advice | Attending: Emergency Medicine | Admitting: Emergency Medicine

## 2013-09-16 ENCOUNTER — Encounter (HOSPITAL_COMMUNITY): Payer: Self-pay | Admitting: Emergency Medicine

## 2013-09-16 DIAGNOSIS — M79605 Pain in left leg: Secondary | ICD-10-CM

## 2013-09-16 DIAGNOSIS — F17200 Nicotine dependence, unspecified, uncomplicated: Secondary | ICD-10-CM | POA: Insufficient documentation

## 2013-09-16 DIAGNOSIS — I10 Essential (primary) hypertension: Secondary | ICD-10-CM | POA: Insufficient documentation

## 2013-09-16 DIAGNOSIS — IMO0002 Reserved for concepts with insufficient information to code with codable children: Secondary | ICD-10-CM | POA: Insufficient documentation

## 2013-09-16 DIAGNOSIS — G8929 Other chronic pain: Secondary | ICD-10-CM | POA: Insufficient documentation

## 2013-09-16 DIAGNOSIS — Z794 Long term (current) use of insulin: Secondary | ICD-10-CM | POA: Insufficient documentation

## 2013-09-16 DIAGNOSIS — M6281 Muscle weakness (generalized): Secondary | ICD-10-CM | POA: Insufficient documentation

## 2013-09-16 DIAGNOSIS — R209 Unspecified disturbances of skin sensation: Secondary | ICD-10-CM | POA: Insufficient documentation

## 2013-09-16 DIAGNOSIS — Z79899 Other long term (current) drug therapy: Secondary | ICD-10-CM | POA: Insufficient documentation

## 2013-09-16 DIAGNOSIS — R269 Unspecified abnormalities of gait and mobility: Secondary | ICD-10-CM | POA: Insufficient documentation

## 2013-09-16 DIAGNOSIS — E119 Type 2 diabetes mellitus without complications: Secondary | ICD-10-CM | POA: Insufficient documentation

## 2013-09-16 DIAGNOSIS — M79609 Pain in unspecified limb: Secondary | ICD-10-CM | POA: Insufficient documentation

## 2013-09-16 MED ORDER — OXYCODONE-ACETAMINOPHEN 5-325 MG PO TABS: 1.0000 | ORAL_TABLET | Freq: Once | ORAL | Status: DC

## 2013-09-16 NOTE — ED Provider Notes (Signed)
Medical screening examination/treatment/procedure(s) were performed by non-physician practitioner and as supervising physician I was immediately available for consultation/collaboration.  EKG Interpretation   None         Darlys Gales, MD 09/16/13 1721

## 2013-09-16 NOTE — ED Provider Notes (Signed)
CSN: [id number]     Arrival date & time 09/16/13  1478 History   None    No chief complaint on file.  (Consider location/radiation/quality/duration/timing/severity/associated sxs/prior Treatment) HPI Comments: 47 yo male with hx of DM, HTN, self report lumbar disc pathology and neuropathic pain (secondary to his poorly controlled DM) presents with c/o onset of left leg numbness and weakness starting last night around 930pm. Reports always having pain with some numbness to the areas, but felt the weakness was new, worse with movement and not improved with rest, and essentially unchanged since onset. Has not taken anything for the pain today. Denies headache, facial weakness, slurred speech, bowel/bladder incontinence.  The history is provided by the patient.    Past Medical History  Diagnosis Date  . Diabetes mellitus   . Hypertension   . Neuropathic pain    No past surgical history on file. No family history on file. History  Substance Use Topics  . Smoking status: Current Every Day Smoker -- 0.50 packs/day  . Smokeless tobacco: Not on file  . Alcohol Use: No    Review of Systems  Constitutional: Negative for fever.  HENT: Negative for rhinorrhea and sore throat.   Eyes: Negative for visual disturbance.  Respiratory: Negative for cough and shortness of breath.   Cardiovascular: Negative for chest pain, palpitations and leg swelling.  Gastrointestinal: Negative for vomiting and abdominal pain.  Genitourinary: Negative for difficulty urinating.  Musculoskeletal: Positive for gait problem.  Skin: Negative for rash.  Neurological: Positive for weakness and numbness. Negative for dizziness, tremors, syncope, facial asymmetry, speech difficulty, light-headedness and headaches.  Hematological: Negative for adenopathy.  Psychiatric/Behavioral: Negative for agitation.    Allergies  Review of patient's allergies indicates no known allergies.  Home Medications   Current Outpatient  Rx  Name  Route  Sig  Dispense  Refill  . albuterol (PROVENTIL HFA;VENTOLIN HFA) 108 (90 BASE) MCG/ACT inhaler   Inhalation   Inhale 2 puffs into the lungs 2 (two) times daily as needed. For shortness of breath.         Marland Kitchen amitriptyline (ELAVIL) 50 MG tablet   Oral   Take 50 mg by mouth at bedtime.         . busPIRone (BUSPAR) 10 MG tablet   Oral   Take 10 mg by mouth 2 (two) times daily.         . cephALEXin (KEFLEX) 500 MG capsule   Oral   Take 500 mg by mouth 2 (two) times daily.         Marland Kitchen glipiZIDE (GLUCOTROL) 10 MG tablet   Oral   Take 10 mg by mouth 2 (two) times daily before a meal.         . insulin glargine (LANTUS) 100 UNIT/ML injection   Subcutaneous   Inject 10 Units into the skin at bedtime.          . lamoTRIgine (LAMICTAL) 100 MG tablet   Oral   Take 100 mg by mouth 2 (two) times daily.         Marland Kitchen lisinopril-hydrochlorothiazide (PRINZIDE,ZESTORETIC) 10-12.5 MG per tablet   Oral   Take 1 tablet by mouth every morning.         . lithium 300 MG tablet   Oral   Take 600 mg by mouth 2 (two) times daily.         . metFORMIN (GLUCOPHAGE) 1000 MG tablet   Oral   Take 1,000 mg by mouth 2 (two)  times daily with a meal.         . risperiDONE (RISPERDAL) 3 MG tablet   Oral   Take 3 mg by mouth 2 (two) times daily.          BP 127/88  Pulse 84  Temp(Src) 97.8 F (36.6 C) (Oral)  Resp 18  Ht 6\' 1"  (1.854 m)  Wt 263 lb (119.296 kg)  BMI 34.71 kg/m2  SpO2 100% Physical Exam  Nursing note and vitals reviewed. Constitutional: He is oriented to person, place, and time. He appears well-developed and well-nourished.  HENT:  Head: Normocephalic and atraumatic.  Right Ear: External ear normal.  Left Ear: External ear normal.  Mouth/Throat: Oropharynx is clear and moist.  Eyes: EOM are normal.  Neck: Normal range of motion.  Cardiovascular: Normal rate, regular rhythm, normal heart sounds and intact distal pulses.   Pulmonary/Chest: Effort  normal and breath sounds normal.  Abdominal: Soft. There is no tenderness.  Musculoskeletal: Normal range of motion. He exhibits tenderness ( diffuse left lower extremity). He exhibits no edema.  Left thigh painful to palpation and with movement.  Neurological: He is alert and oriented to person, place, and time. He displays normal reflexes. No sensory deficit. Coordination and gait normal.  Slightly decreased strength left lower extremity when compared to the right. No foot drop. Ambulates unassisted.  Skin: Skin is warm and dry.  Psychiatric: He has a normal mood and affect.    ED Course  Procedures (including critical care time) Labs Review Labs Reviewed - No data to display Imaging Review No results found.  EKG Interpretation   None       MDM   1. Left leg pain    Patient with chronic pain, HTN, DM presents with left sided leg pain and numbness accompanied by weakness. Neuro exam reveals minimal left sided lower extremity weakness noted with poor effort and pain to the thigh. No foot drop and normal DTR's. No bowel/bladder incontinence, no evidence of cord compression, no evidence of TIA/CVA. Consider lumbar radiculopathy and neuropathic pain. Percocet 1 tab administered.  Returned to the room to reevaluate patient and he had eloped.     Simmie Davies, NP 09/16/13 [phone]

## 2013-09-16 NOTE — ED Notes (Signed)
Pt from home, c/o bilateral leg pain. Pt states hx of diabetic neuropathy, has numbness in lower legs. Pt states his left leg gave out this am.

## 2013-09-16 NOTE — ED Notes (Signed)
RN walked into patient's room to assess him and pt states that he was tired of waiting and he was leaving. Pt was here for 1 hour and 40 minutes. RN asked for patient to sign AMA form and patient refused and walked past nurse with family member who apologized as she was leaving.

## 2013-10-24 ENCOUNTER — Emergency Department (HOSPITAL_COMMUNITY)
Admission: EM | Admit: 2013-10-24 | Discharge: 2013-10-24 | Disposition: A | Discharge status: Home / Self Care | Attending: Emergency Medicine | Admitting: Emergency Medicine

## 2013-10-24 ENCOUNTER — Emergency Department (HOSPITAL_COMMUNITY)

## 2013-10-24 ENCOUNTER — Encounter (HOSPITAL_COMMUNITY): Payer: Self-pay | Admitting: Emergency Medicine

## 2013-10-24 DIAGNOSIS — R197 Diarrhea, unspecified: Secondary | ICD-10-CM | POA: Insufficient documentation

## 2013-10-24 DIAGNOSIS — R059 Cough, unspecified: Secondary | ICD-10-CM

## 2013-10-24 DIAGNOSIS — R0682 Tachypnea, not elsewhere classified: Secondary | ICD-10-CM | POA: Insufficient documentation

## 2013-10-24 DIAGNOSIS — R350 Frequency of micturition: Secondary | ICD-10-CM | POA: Insufficient documentation

## 2013-10-24 DIAGNOSIS — F17200 Nicotine dependence, unspecified, uncomplicated: Secondary | ICD-10-CM | POA: Insufficient documentation

## 2013-10-24 DIAGNOSIS — Z79899 Other long term (current) drug therapy: Secondary | ICD-10-CM | POA: Insufficient documentation

## 2013-10-24 DIAGNOSIS — E860 Dehydration: Secondary | ICD-10-CM

## 2013-10-24 DIAGNOSIS — E119 Type 2 diabetes mellitus without complications: Secondary | ICD-10-CM | POA: Insufficient documentation

## 2013-10-24 DIAGNOSIS — F411 Generalized anxiety disorder: Secondary | ICD-10-CM | POA: Insufficient documentation

## 2013-10-24 DIAGNOSIS — Z794 Long term (current) use of insulin: Secondary | ICD-10-CM | POA: Insufficient documentation

## 2013-10-24 DIAGNOSIS — R739 Hyperglycemia, unspecified: Secondary | ICD-10-CM

## 2013-10-24 DIAGNOSIS — R0602 Shortness of breath: Secondary | ICD-10-CM | POA: Insufficient documentation

## 2013-10-24 DIAGNOSIS — R05 Cough: Secondary | ICD-10-CM

## 2013-10-24 DIAGNOSIS — I10 Essential (primary) hypertension: Secondary | ICD-10-CM | POA: Insufficient documentation

## 2013-10-24 LAB — COMPREHENSIVE METABOLIC PANEL
ALT: 93 U/L — ABNORMAL HIGH (ref 0–53)
AST: 90 U/L — ABNORMAL HIGH (ref 0–37)
CO2: 20 mEq/L (ref 19–32)
Chloride: 95 mEq/L — ABNORMAL LOW (ref 96–112)
Creatinine, Ser: 0.97 mg/dL (ref 0.50–1.35)
GFR calc non Af Amer: >90 mL/min (ref >90)
Sodium: 132 mEq/L — ABNORMAL LOW (ref 135–145)
Total Bilirubin: 0.8 mg/dL (ref 0.3–1.2)

## 2013-10-24 LAB — CBC WITH DIFFERENTIAL/PLATELET
Basophils Absolute: 0 10*3/uL (ref 0.0–0.1)
HCT: 42.8 % (ref 39.0–52.0)
Lymphocytes Relative: 17 % (ref 12–46)
Lymphs Abs: 0.8 10*3/uL (ref 0.7–4.0)
Monocytes Absolute: 0.5 10*3/uL (ref 0.1–1.0)
Neutro Abs: 3.3 10*3/uL (ref 1.7–7.7)
RBC: 4.97 MIL/uL (ref 4.22–5.81)
RDW: 13.2 % (ref 11.5–15.5)
WBC: 4.7 10*3/uL (ref 4.0–10.5)

## 2013-10-24 LAB — URINE MICROSCOPIC-ADD ON

## 2013-10-24 LAB — URINALYSIS, ROUTINE W REFLEX MICROSCOPIC
Bilirubin Urine: NEGATIVE
Ketones, ur: 40 mg/dL — AB
Protein, ur: 30 mg/dL — AB
Urobilinogen, UA: 0.2 mg/dL (ref 0.0–1.0)

## 2013-10-24 LAB — GLUCOSE, CAPILLARY
Glucose-Capillary: 334 mg/dL — ABNORMAL HIGH (ref 70–99)
Glucose-Capillary: 320 mg/dL — ABNORMAL HIGH (ref 70–99)

## 2013-10-24 LAB — TROPONIN I: Troponin I: <0.3 ng/mL (ref <0.30)

## 2013-10-24 LAB — D-DIMER, QUANTITATIVE (NOT AT ARMC): D-Dimer, Quant: 0.51 ug/mL-FEU — ABNORMAL HIGH (ref 0.00–0.48)

## 2013-10-24 MED ORDER — SODIUM CHLORIDE 0.9 % IV BOLUS (SEPSIS)
500.0000 mL | Freq: Once | INTRAVENOUS | Status: AC
  Administered 2013-10-24: 500 mL via INTRAVENOUS

## 2013-10-24 MED ORDER — SODIUM CHLORIDE 0.9 % IV BOLUS (SEPSIS)
500.0000 mL | Freq: Once | INTRAVENOUS | Status: AC
  Administered 2013-10-24: 12:00:00 via INTRAVENOUS

## 2013-10-24 MED ORDER — IOHEXOL 350 MG/ML SOLN
100.0000 mL | Freq: Once | INTRAVENOUS | Status: AC | PRN
  Administered 2013-10-24: 100 mL via INTRAVENOUS

## 2013-10-24 MED ORDER — LORAZEPAM 2 MG/ML IJ SOLN
1.0000 mg | Freq: Once | INTRAMUSCULAR | Status: AC
  Administered 2013-10-24: 1 mg via INTRAVENOUS
  Filled 2013-10-24: qty 1

## 2013-10-24 MED ORDER — INSULIN ASPART 100 UNIT/ML ~~LOC~~ SOLN
8.0000 [IU] | Freq: Once | SUBCUTANEOUS | Status: AC
  Administered 2013-10-24: 8 [IU] via INTRAVENOUS
  Filled 2013-10-24: qty 1

## 2013-10-24 MED ORDER — HYDROCODONE-ACETAMINOPHEN 5-325 MG PO TABS: 1.0000 | ORAL_TABLET | ORAL | Status: DC | PRN

## 2013-10-24 NOTE — ED Provider Notes (Signed)
CSN: [id number]     Arrival date & time 10/24/13  4098 History   First MD Initiated Contact with Patient 10/24/13 1113     Chief Complaint  Patient presents with  . Shortness of Breath  . Diarrhea  . Hyperglycemia   (Consider location/radiation/quality/duration/timing/severity/associated sxs/prior Treatment) Patient is a 47 y.o. male presenting with shortness of breath, diarrhea, and hyperglycemia. The history is provided by the patient.  Shortness of Breath Severity:  Moderate Associated symptoms: no abdominal pain, no chest pain, no headaches, no rash and no vomiting   Diarrhea Associated symptoms: no abdominal pain, no headaches and no vomiting   Hyperglycemia Associated symptoms: shortness of breath   Associated symptoms: no abdominal pain, no chest pain, no nausea and no vomiting    patient's had shortness of breath over the last couple days. He states is worse with laying down. It is better sitting up. He has had a mild cough without production. No fevers. No chest pain. No swelling of his legs. He states his sugars have been running around 400. He is a current smoker.  Past Medical History  Diagnosis Date  . Diabetes mellitus   . Hypertension   . Neuropathic pain    History reviewed. No pertinent past surgical history. History reviewed. No pertinent family history. History  Substance Use Topics  . Smoking status: Current Every Day Smoker -- 0.50 packs/day  . Smokeless tobacco: Not on file  . Alcohol Use: No    Review of Systems  Constitutional: Negative for activity change and appetite change.  Eyes: Negative for pain.  Respiratory: Positive for shortness of breath. Negative for chest tightness.   Cardiovascular: Negative for chest pain and leg swelling.  Gastrointestinal: Positive for diarrhea. Negative for nausea, vomiting and abdominal pain.  Genitourinary: Positive for frequency. Negative for flank pain.  Musculoskeletal: Negative for back pain and neck  stiffness.  Skin: Negative for rash.  Neurological: Negative for weakness, numbness and headaches.  Psychiatric/Behavioral: Negative for behavioral problems.    Allergies  Review of patient's allergies indicates no known allergies.  Home Medications   Current Outpatient Rx  Name  Route  Sig  Dispense  Refill  . amitriptyline (ELAVIL) 50 MG tablet   Oral   Take 50 mg by mouth at bedtime.         . busPIRone (BUSPAR) 10 MG tablet   Oral   Take 10 mg by mouth 2 (two) times daily.         Marland Kitchen glipiZIDE (GLUCOTROL) 10 MG tablet   Oral   Take 10 mg by mouth 2 (two) times daily before a meal.         . ibuprofen (ADVIL,MOTRIN) 200 MG tablet   Oral   Take 800 mg by mouth 4 (four) times daily as needed (pain).         . insulin glargine (LANTUS) 100 UNIT/ML injection   Subcutaneous   Inject 185 Units into the skin at bedtime.          . lamoTRIgine (LAMICTAL) 100 MG tablet   Oral   Take 100 mg by mouth 2 (two) times daily.         Marland Kitchen lisinopril-hydrochlorothiazide (PRINZIDE,ZESTORETIC) 10-12.5 MG per tablet   Oral   Take 1 tablet by mouth every morning.         . lithium 300 MG tablet   Oral   Take 900 mg by mouth 2 (two) times daily.          Marland Kitchen  metFORMIN (GLUCOPHAGE) 1000 MG tablet   Oral   Take 1,000 mg by mouth 2 (two) times daily with a meal.         . risperiDONE (RISPERDAL) 3 MG tablet   Oral   Take 3 mg by mouth 2 (two) times daily.         Marland Kitchen HYDROcodone-acetaminophen (NORCO/VICODIN) 5-325 MG per tablet   Oral   Take 1-2 tablets by mouth every 4 (four) hours as needed for moderate pain (cough).   10 tablet   0    BP 108/67  Pulse 115  Temp(Src) 99.3 F (37.4 C) (Oral)  Resp 20  SpO2 100% Physical Exam  Constitutional: He is oriented to person, place, and time. He appears well-developed and well-nourished.  HENT:  Head: Normocephalic.  Eyes: Pupils are equal, round, and reactive to light.  Neck: Normal range of motion. Neck supple.   Cardiovascular:  tachypnea  Pulmonary/Chest: Effort normal.  tachypnea  Abdominal: Soft. There is no tenderness.  Musculoskeletal: Normal range of motion. He exhibits no edema.  Neurological: He is alert and oriented to person, place, and time.  Skin: Skin is warm.  Psychiatric:  Patient appears somewhat anxious    ED Course  Procedures (including critical care time) Labs Review Labs Reviewed  GLUCOSE, CAPILLARY - Abnormal; Notable for the following:    Glucose-Capillary 334 (*)    All other components within normal limits  CBC WITH DIFFERENTIAL - Abnormal; Notable for the following:    Platelets 144 (*)    All other components within normal limits  COMPREHENSIVE METABOLIC PANEL - Abnormal; Notable for the following:    Sodium 132 (*)    Chloride 95 (*)    Glucose, Bld 366 (*)    AST 90 (*)    ALT 93 (*)    All other components within normal limits  URINALYSIS, ROUTINE W REFLEX MICROSCOPIC - Abnormal; Notable for the following:    Color, Urine AMBER (*)    Specific Gravity, Urine >1.046 (*)    Glucose, UA >1000 (*)    Hgb urine dipstick TRACE (*)    Ketones, ur 40 (*)    Protein, ur 30 (*)    All other components within normal limits  D-DIMER, QUANTITATIVE - Abnormal; Notable for the following:    D-Dimer, Quant 0.51 (*)    All other components within normal limits  URINE MICROSCOPIC-ADD ON - Abnormal; Notable for the following:    Bacteria, UA FEW (*)    All other components within normal limits  GLUCOSE, CAPILLARY - Abnormal; Notable for the following:    Glucose-Capillary 320 (*)    All other components within normal limits  BLOOD GAS, VENOUS - Abnormal; Notable for the following:    pH, Ven 7.419 (*)    pCO2, Ven 37.0 (*)    pO2, Ven 00.0 (*)    All other components within normal limits  TROPONIN I  LITHIUM LEVEL   Imaging Review Dg Chest 2 View  10/24/2013   CLINICAL DATA:  Shortness of breath, cough  EXAM: CHEST  2 VIEW  COMPARISON:  February 15, 2013   FINDINGS: The heart size and mediastinal contours are within normal limits. There is no focal infiltrate, pulmonary edema, or pleural effusion. The visualized skeletal structures are stable.  IMPRESSION: No active cardiopulmonary disease.   Electronically Signed   By: Sherian Rein M.D.   On: 10/24/2013 11:53   Ct Angio Chest W/cm &/or Wo Cm  10/24/2013  CLINICAL DATA:  Shortness of breath.  Chest pain.  Smoker.  EXAM: CT ANGIOGRAPHY CHEST WITH CONTRAST  TECHNIQUE: Multidetector CT imaging of the chest was performed using the standard protocol during bolus administration of intravenous contrast. Multiplanar CT image reconstructions including MIPs were obtained to evaluate the vascular anatomy.  CONTRAST:  OMNIPAQUE IOHEXOL 350 MG/ML SOLN  COMPARISON:  None.  FINDINGS: Satisfactory opacification of pulmonary arteries noted, and no pulmonary emboli identified. No evidence of thoracic aortic dissection or aneurysm. No evidence of mediastinal hematoma or mass.  No lymphadenopathy identified within the thorax. No evidence of pleural or pericardial effusion. No evidence of pulmonary infiltrate or mass. No evidence of central endobronchial lesion. A 5 mm noncalcified pulmonary nodule is seen in the left lower lobe on image 68. A subpleural right lower lobe nodule is seen measuring 4 mm on image 60. No evidence of chest wall mass or suspicious bone lesions.  Review of the MIP images confirms the above findings.  IMPRESSION: No evidence of pulmonary embolism or other acute findings.  Bilateral lower lobe indeterminate pulmonary nodules, largest measuring 5 mm. Given risk factors for bronchogenic carcinoma, follow-up chest CT at 6 - 12 months is recommended. This recommendation follows the consensus statement: Guidelines for Management of Small Pulmonary Nodules Detected on CT Scans: A Statement from the Fleischner Society as published in Radiology 2005;237:395-400.   Electronically Signed   By: Myles Rosenthal M.D.    On: 10/24/2013 13:08    EKG Interpretation   None       MDM   1. Hyperglycemia   2. Dehydration   3. Cough    Patient with cough and some shortness of breath. Hyperglycemia without frank DKA. Patient feels better after IV fluids. CT scan due to dyspnea and positive d-dimer. He was negative for pulmonary embolus but did show pulmonary nodules. Patient was informed of this and will followup with PCP. Patient feels much better and she could be discharged home.    Juliet Rude. Rubin Payor, MD 10/24/13 [phone]

## 2013-10-24 NOTE — ED Notes (Signed)
MD at bedside. 

## 2013-10-24 NOTE — ED Notes (Signed)
Pt states he is feeling better than he did on arrival.

## 2013-10-24 NOTE — ED Notes (Signed)
Pt c/o of cough, SOB, diarrhea, and hyperglycemia x4 days. States that his BS was over 400 this am. Denies chest pain.

## 2013-10-24 NOTE — ED Notes (Signed)
Report received from D. Kinney RN. 

## 2013-10-24 NOTE — Progress Notes (Signed)
[facility] provided pt with a list of primary care resources, [facility] [employer] application and information on [facility].

## 2013-10-25 LAB — BLOOD GAS, VENOUS
Bicarbonate: 23.5 mEq/L (ref 20.0–24.0)
FIO2: 0.21 %
O2 Saturation: 54.2 %
Patient temperature: 98.6
TCO2: 20.7 mmol/L (ref 0–100)
pH, Ven: 7.419 — ABNORMAL HIGH (ref 7.250–7.300)

## 2014-01-03 IMAGING — CT CT ANGIO CHEST
1 of 2 series · 19 of 32 positions shown · IV contrast (OMNIPAQUE 350)
Comparison: None.

CLINICAL DATA: Shortness of breath.  Chest pain.  Smoker.

EXAM:
CT ANGIOGRAPHY CHEST WITH CONTRAST
TECHNIQUE: Multidetector CT imaging of the chest was performed using the
standard protocol during bolus administration of intravenous
contrast. Multiplanar CT image reconstructions including MIPs were
obtained to evaluate the vascular anatomy.
CONTRAST:  100mL OMNIPAQUE IOHEXOL 350 MG/ML SOLN

[Series 6: thins for pacs · axial · 0.81mm/px · z∈[-261,-24]mm · 19 of 265 slices shown]
[im 14/265  lung]
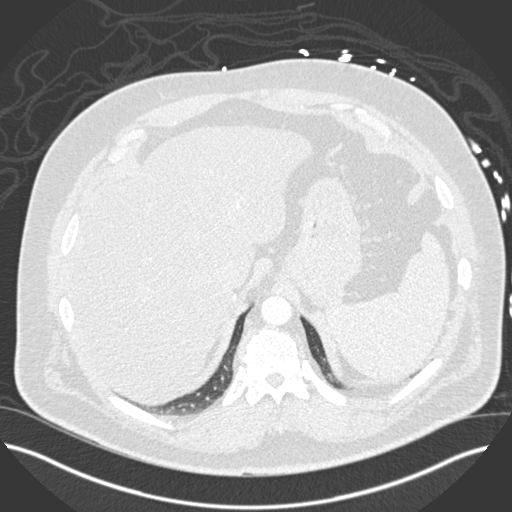
[im 27/265  mediastinal]
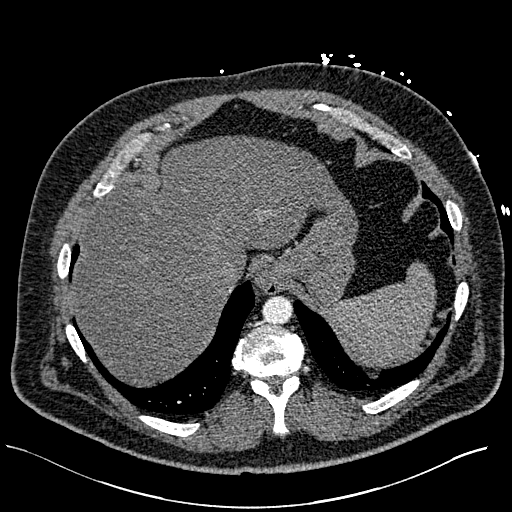
[im 40/265  lung]
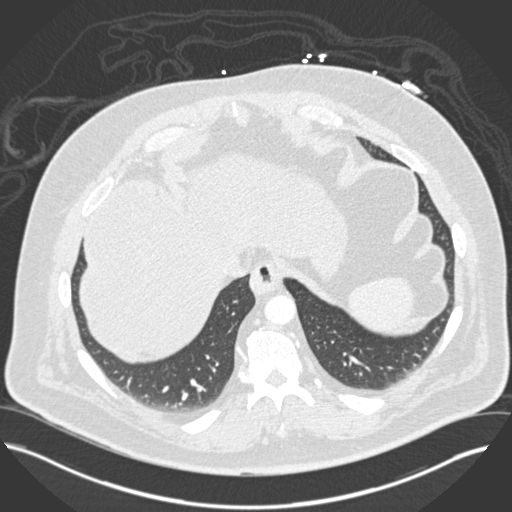
[im 67/265  mediastinal]
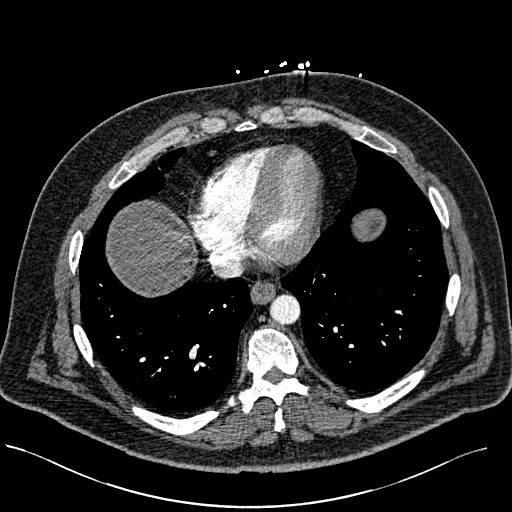
[im 80/265  lung]
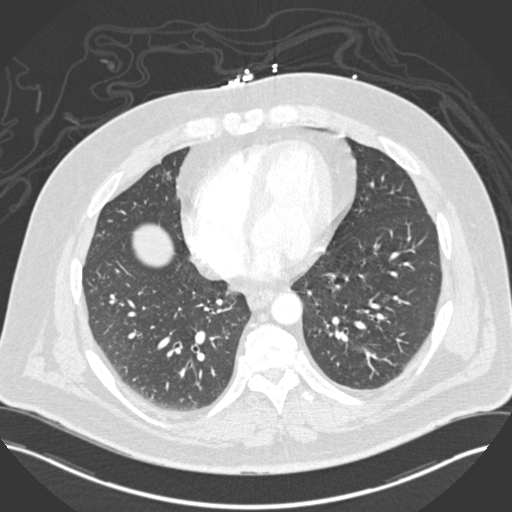
[im 89/265  mediastinal]
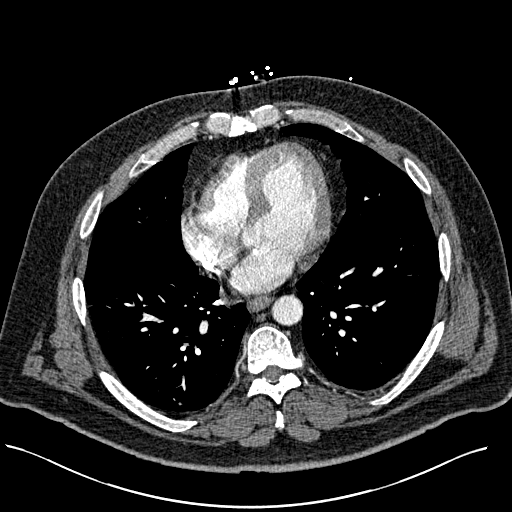
[im 93/265  lung]
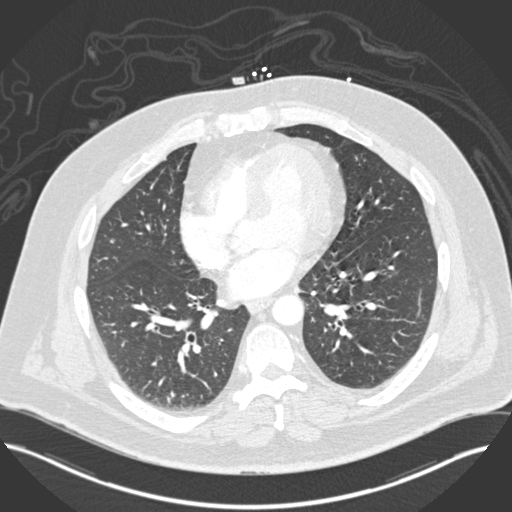
[im 106/265  mediastinal]
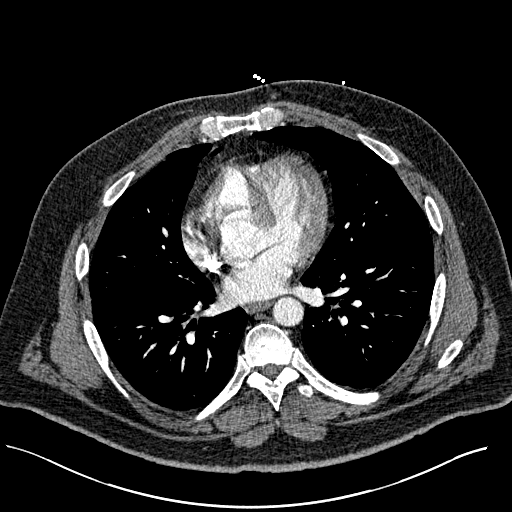
[im 119/265  lung]
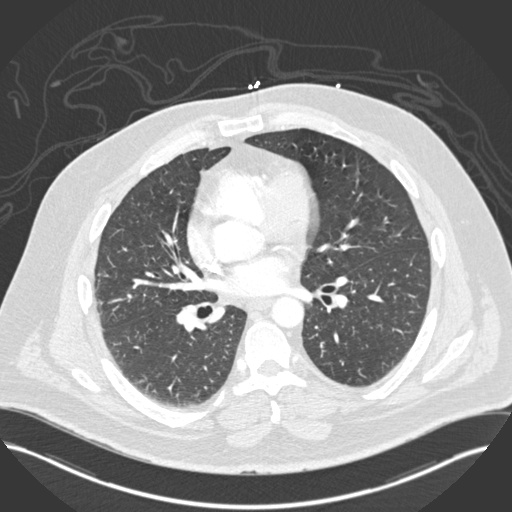
[im 133/265  mediastinal]
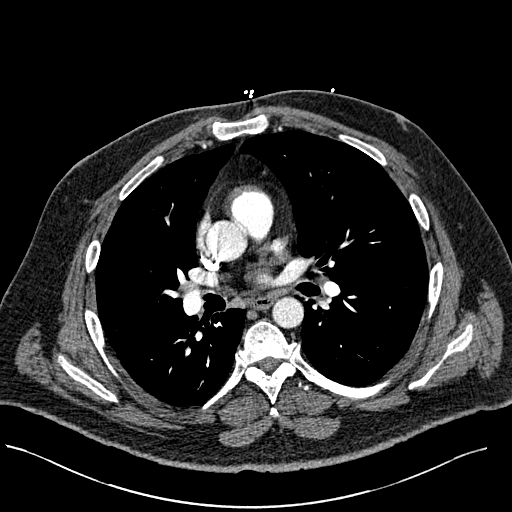
[im 146/265  lung]
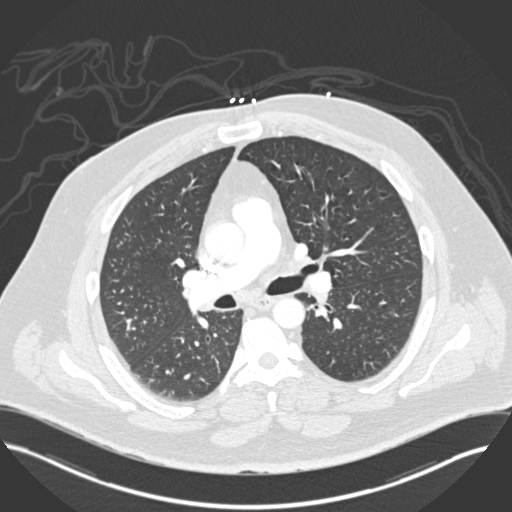
[im 159/265  mediastinal]
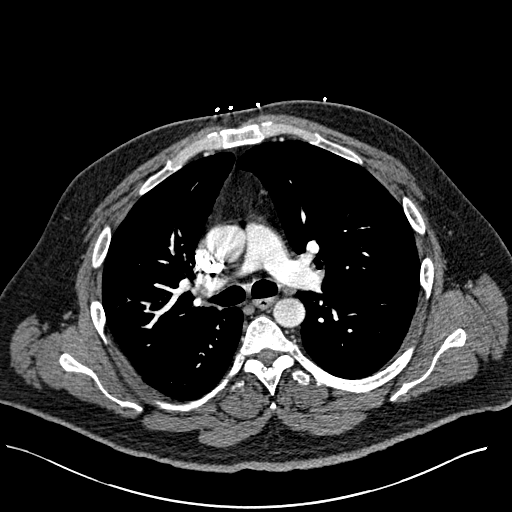
[im 172/265  lung]
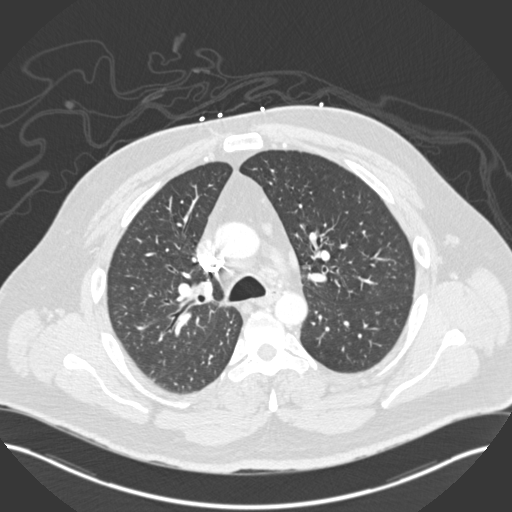
[im 177/265  mediastinal]
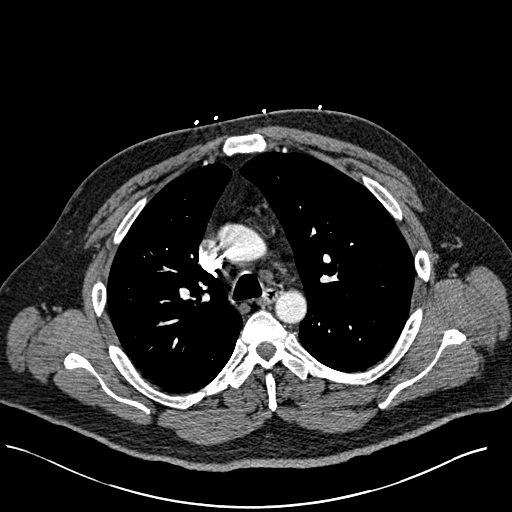
[im 185/265  lung]
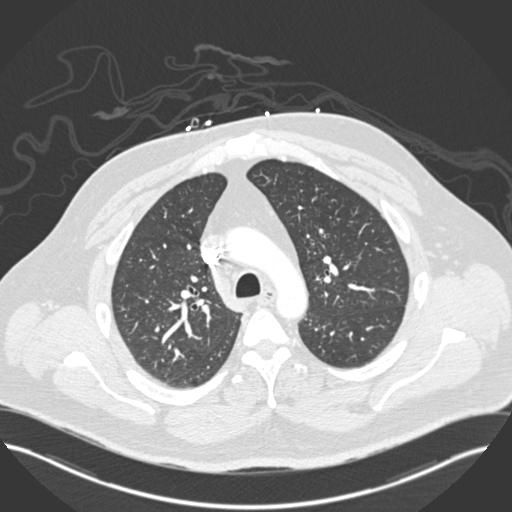
[im 199/265  mediastinal]
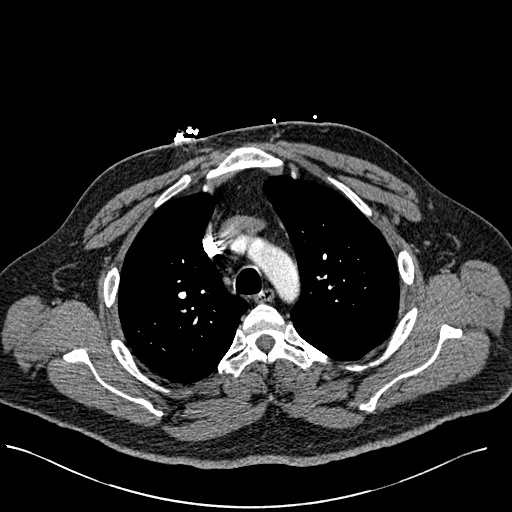
[im 225/265  lung]
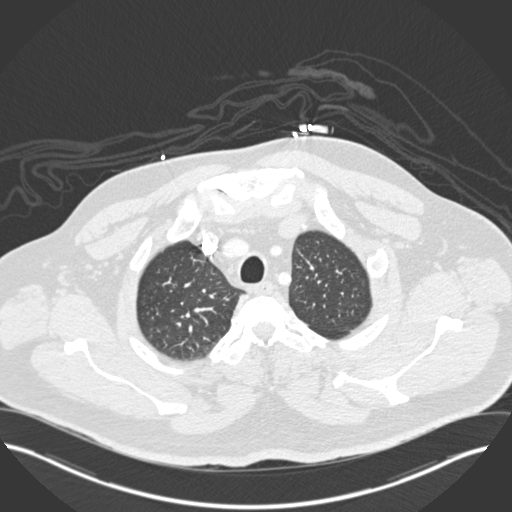
[im 238/265  mediastinal]
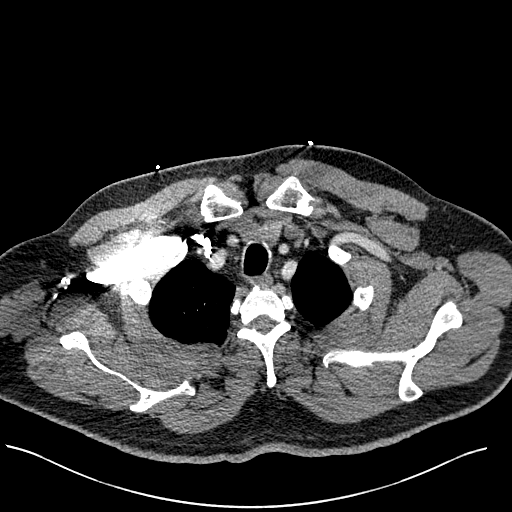
[im 251/265  lung]
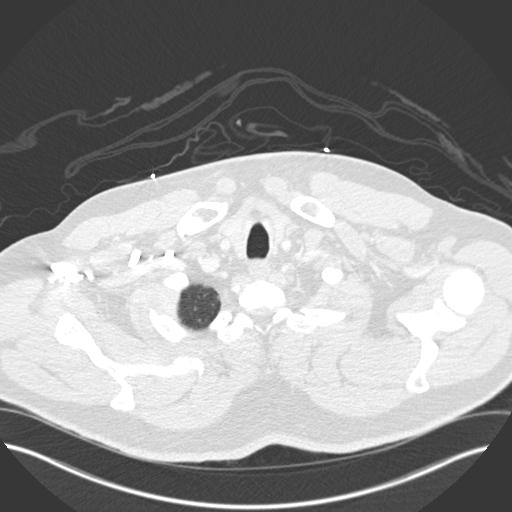

[19 of 32 positions shown; findings below may reference images not displayed]

FINDINGS: Satisfactory opacification of pulmonary arteries noted, and no
pulmonary emboli identified. No evidence of thoracic aortic
dissection or aneurysm. No evidence of mediastinal hematoma or mass.

No lymphadenopathy identified within the thorax. No evidence of
pleural or pericardial effusion. No evidence of pulmonary infiltrate
or mass. No evidence of central endobronchial lesion. A 5 mm
noncalcified pulmonary nodule is seen in the left lower lobe on
image 68. A subpleural right lower lobe nodule is seen measuring 4
mm on image 60. No evidence of chest wall mass or suspicious bone
lesions.

Review of the MIP images confirms the above findings.
IMPRESSION: No evidence of pulmonary embolism or other acute findings.

Bilateral lower lobe indeterminate pulmonary nodules, largest
measuring 5 mm. Given risk factors for bronchogenic carcinoma,
follow-up chest CT at 6 - 12 months is recommended. This
recommendation follows the consensus statement: Guidelines for
Management of Small Pulmonary Nodules Detected on CT Scans: A
Statement from the [HOSPITAL] as published in Radiology

## 2014-02-03 ENCOUNTER — Emergency Department (HOSPITAL_COMMUNITY)
Admission: EM | Admit: 2014-02-03 | Discharge: 2014-02-03 | Disposition: A | Discharge status: Home / Self Care | Attending: Emergency Medicine | Admitting: Emergency Medicine

## 2014-02-03 ENCOUNTER — Encounter (HOSPITAL_COMMUNITY): Payer: Self-pay | Admitting: Emergency Medicine

## 2014-02-03 DIAGNOSIS — S139XXA Sprain of joints and ligaments of unspecified parts of neck, initial encounter: Secondary | ICD-10-CM | POA: Insufficient documentation

## 2014-02-03 DIAGNOSIS — Y9389 Activity, other specified: Secondary | ICD-10-CM | POA: Insufficient documentation

## 2014-02-03 DIAGNOSIS — S161XXA Strain of muscle, fascia and tendon at neck level, initial encounter: Secondary | ICD-10-CM

## 2014-02-03 DIAGNOSIS — X500XXA Overexertion from strenuous movement or load, initial encounter: Secondary | ICD-10-CM | POA: Insufficient documentation

## 2014-02-03 DIAGNOSIS — I10 Essential (primary) hypertension: Secondary | ICD-10-CM | POA: Insufficient documentation

## 2014-02-03 DIAGNOSIS — Z794 Long term (current) use of insulin: Secondary | ICD-10-CM | POA: Insufficient documentation

## 2014-02-03 DIAGNOSIS — Y9289 Other specified places as the place of occurrence of the external cause: Secondary | ICD-10-CM | POA: Insufficient documentation

## 2014-02-03 DIAGNOSIS — Z79899 Other long term (current) drug therapy: Secondary | ICD-10-CM | POA: Insufficient documentation

## 2014-02-03 DIAGNOSIS — F17200 Nicotine dependence, unspecified, uncomplicated: Secondary | ICD-10-CM | POA: Insufficient documentation

## 2014-02-03 DIAGNOSIS — E119 Type 2 diabetes mellitus without complications: Secondary | ICD-10-CM | POA: Insufficient documentation

## 2014-02-03 MED ORDER — DIAZEPAM 5 MG PO TABS
10.0000 mg | ORAL_TABLET | Freq: Once | ORAL | Status: AC
  Administered 2014-02-03: 10 mg via ORAL
  Filled 2014-02-03: qty 2

## 2014-02-03 MED ORDER — OXYCODONE-ACETAMINOPHEN 5-325 MG PO TABS
2.0000 | ORAL_TABLET | Freq: Once | ORAL | Status: AC
  Administered 2014-02-03: 2 via ORAL
  Filled 2014-02-03: qty 2

## 2014-02-03 MED ORDER — OXYCODONE-ACETAMINOPHEN 5-325 MG PO TABS: 1.0000 | ORAL_TABLET | Freq: Three times a day (TID) | ORAL | Status: AC | PRN

## 2014-02-03 MED ORDER — CYCLOBENZAPRINE HCL 10 MG PO TABS: 10.0000 mg | ORAL_TABLET | Freq: Two times a day (BID) | ORAL | Status: AC | PRN

## 2014-02-03 NOTE — ED Notes (Signed)
Smith, NP at bedside.  

## 2014-02-03 NOTE — ED Notes (Signed)
No answer from pt in waiting room 

## 2014-02-03 NOTE — Discharge Instructions (Signed)

## 2014-02-03 NOTE — ED Provider Notes (Signed)
CSN: [id number]     Arrival date & time 02/03/14  2046 History  This chart was scribed for non-physician practitioner Felicie Morn, NP working with Glynn Octave, MD by Valera Castle, ED scribe. This patient was seen in room TR09C/TR09C and the patient's care was started at 10:45 PM.   Chief Complaint  Patient presents with  . Shoulder Injury   (Consider location/radiation/quality/duration/timing/severity/associated sxs/prior Treatment) Patient is a 48 y.o. male presenting with shoulder injury. The history is provided by the patient. No language interpreter was used.  Shoulder Injury This is a new problem. The current episode started 1 to 2 hours ago. The problem occurs constantly. The problem has not changed since onset.Pertinent negatives include no chest pain. Exacerbated by: movement.   HPI Comments: Randall Carroll is a 48 y.o. male who presents to the Emergency Department complaining of constant right shoulder pain, onset earlier this evening after picking up the front end of his truck. He reports that movement exacerbates his shoulder pain and reports the pain radiates to his neck. He denies any other symptoms. He reports taking medication for h/o neuropathy, DM, HTN.   PCP Dartha Lodge, FNP  Past Medical History  Diagnosis Date  . Diabetes mellitus   . Hypertension   . Neuropathic pain    History reviewed. No pertinent past surgical history. No family history on file. History  Substance Use Topics  . Smoking status: Current Every Day Smoker -- 0.50 packs/day  . Smokeless tobacco: Not on file  . Alcohol Use: No    Review of Systems  Cardiovascular: Negative for chest pain.  Musculoskeletal: Positive for myalgias (right shoulder) and neck pain.  Skin: Negative for wound.  All other systems reviewed and are negative.   Allergies  Review of patient's allergies indicates no known allergies.  Home Medications   Current Outpatient Rx  Name  Route  Sig  Dispense  Refill   . amitriptyline (ELAVIL) 100 MG tablet   Oral   Take 100 mg by mouth 2 (two) times daily.         . busPIRone (BUSPAR) 10 MG tablet   Oral   Take 20 mg by mouth 3 (three) times daily.         Marland Kitchen glipiZIDE (GLUCOTROL) 10 MG tablet   Oral   Take 10 mg by mouth 2 (two) times daily before a meal.         . ibuprofen (ADVIL,MOTRIN) 200 MG tablet   Oral   Take 800 mg by mouth 4 (four) times daily as needed (pain).         . Insulin Aspart Prot & Aspart (NOVOLOG MIX 70/30 FLEXPEN [place])   Subcutaneous   Inject 30 Units into the skin 3 (three) times daily.         Marland Kitchen lamoTRIgine (LAMICTAL) 100 MG tablet   Oral   Take 100 mg by mouth 2 (two) times daily.         Marland Kitchen lisinopril-hydrochlorothiazide (PRINZIDE,ZESTORETIC) 10-12.5 MG per tablet   Oral   Take 1 tablet by mouth every morning.         . lithium 300 MG tablet   Oral   Take 900 mg by mouth 3 (three) times daily.          . risperiDONE (RISPERDAL) 3 MG tablet   Oral   Take 9 mg by mouth 2 (two) times daily.          . sitaGLIPtin-metformin (JANUMET) 50-1000 MG per tablet  Oral   Take 1 tablet by mouth 2 (two) times daily with a meal.          BP 117/85  Pulse 93  Temp(Src) 98.2 F (36.8 C) (Oral)  Resp 22  Ht 6\' 2"  (1.88 m)  Wt 233 lb (105.688 kg)  BMI 29.90 kg/m2  SpO2 96%  Physical Exam  Nursing note and vitals reviewed. Constitutional: He is oriented to person, place, and time. He appears well-developed and well-nourished. No distress.  HENT:  Head: Normocephalic and atraumatic.  Eyes: EOM are normal.  Neck: Neck supple. No tracheal deviation present.  Cardiovascular: Normal rate.   Pulmonary/Chest: Effort normal. No respiratory distress.  Musculoskeletal: Normal range of motion. He exhibits tenderness.  Lateral neck discomfort on the right. Increased pain with lateral rotation to the left.   Neurological: He is alert and oriented to person, place, and time.  Skin: Skin is warm and dry.   Psychiatric: He has a normal mood and affect. His behavior is normal.    ED Course  Procedures (including critical care time)  DIAGNOSTIC STUDIES: Oxygen Saturation is 96% on room air, normal by my interpretation.    COORDINATION OF CARE: 10:49 PM-Discussed treatment plan which includes pain medication with pt at bedside and pt agreed to plan.   Labs Review Labs Reviewed - No data to display Imaging Review No results found.   EKG Interpretation None     Medications - No data to display MDM   Final diagnoses:  None    Pain is prominent in the right lateral aspect of the neck.  Pain worse with rotation of head to left and with movement of right arm/shoulder.  No clavicular or scapular pain.  Cervical muscle strain.  I personally performed the services described in this documentation, which was scribed in my presence. The recorded information has been reviewed and is accurate.    Jimmye Normanavid John Ravin Bendall, NP 02/04/14 [phone]

## 2014-02-03 NOTE — ED Notes (Signed)
Pt st's he developed pain in right shoulder earlier tonight after picking up the front of his truck.  Pt c/o pain with movement.

## 2014-02-04 NOTE — ED Provider Notes (Signed)
Medical screening examination/treatment/procedure(s) were performed by non-physician practitioner and as supervising physician I was immediately available for consultation/collaboration.   EKG Interpretation None       Glynn OctaveStephen Kynadee Dam, MD 02/04/14 1015

## 2014-04-25 ENCOUNTER — Emergency Department (HOSPITAL_COMMUNITY)

## 2014-04-25 ENCOUNTER — Encounter (HOSPITAL_COMMUNITY): Payer: Self-pay | Admitting: Emergency Medicine

## 2014-04-25 ENCOUNTER — Emergency Department (HOSPITAL_COMMUNITY)
Admission: EM | Admit: 2014-04-25 | Discharge: 2014-04-25 | Disposition: A | Discharge status: Home / Self Care | Attending: Emergency Medicine | Admitting: Emergency Medicine

## 2014-04-25 DIAGNOSIS — E119 Type 2 diabetes mellitus without complications: Secondary | ICD-10-CM | POA: Insufficient documentation

## 2014-04-25 DIAGNOSIS — L0390 Cellulitis, unspecified: Secondary | ICD-10-CM

## 2014-04-25 DIAGNOSIS — Z79899 Other long term (current) drug therapy: Secondary | ICD-10-CM | POA: Insufficient documentation

## 2014-04-25 DIAGNOSIS — L02818 Cutaneous abscess of other sites: Secondary | ICD-10-CM | POA: Insufficient documentation

## 2014-04-25 DIAGNOSIS — Z791 Long term (current) use of non-steroidal anti-inflammatories (NSAID): Secondary | ICD-10-CM | POA: Insufficient documentation

## 2014-04-25 DIAGNOSIS — I10 Essential (primary) hypertension: Secondary | ICD-10-CM | POA: Insufficient documentation

## 2014-04-25 DIAGNOSIS — Z794 Long term (current) use of insulin: Secondary | ICD-10-CM | POA: Insufficient documentation

## 2014-04-25 DIAGNOSIS — M79609 Pain in unspecified limb: Secondary | ICD-10-CM | POA: Insufficient documentation

## 2014-04-25 DIAGNOSIS — L03818 Cellulitis of other sites: Principal | ICD-10-CM

## 2014-04-25 DIAGNOSIS — F17200 Nicotine dependence, unspecified, uncomplicated: Secondary | ICD-10-CM | POA: Insufficient documentation

## 2014-04-25 LAB — COMPREHENSIVE METABOLIC PANEL
ALK PHOS: 137 U/L — AB (ref 39–117)
ALT: 23 U/L (ref 0–53)
AST: 19 U/L (ref 0–37)
Albumin: 3.8 g/dL (ref 3.5–5.2)
BILIRUBIN TOTAL: 0.4 mg/dL (ref 0.3–1.2)
BUN: 11 mg/dL (ref 6–23)
CHLORIDE: 97 meq/L (ref 96–112)
CO2: 20 mEq/L (ref 19–32)
Calcium: 9.5 mg/dL (ref 8.4–10.5)
Creatinine, Ser: 0.78 mg/dL (ref 0.50–1.35)
GLUCOSE: 388 mg/dL — AB (ref 70–99)
Potassium: 3.8 mEq/L (ref 3.7–5.3)
Sodium: 135 mEq/L — ABNORMAL LOW (ref 137–147)
Total Protein: 7.4 g/dL (ref 6.0–8.3)

## 2014-04-25 LAB — CBC WITH DIFFERENTIAL/PLATELET
Basophils Absolute: 0 10*3/uL (ref 0.0–0.1)
Basophils Relative: 0 % (ref 0–1)
Eosinophils Absolute: 0.2 10*3/uL (ref 0.0–0.7)
Eosinophils Relative: 2 % (ref 0–5)
HEMATOCRIT: 43.2 % (ref 39.0–52.0)
HEMOGLOBIN: 15.5 g/dL (ref 13.0–17.0)
LYMPHS ABS: 2.8 10*3/uL (ref 0.7–4.0)
Lymphocytes Relative: 28 % (ref 12–46)
MCH: 31.3 pg (ref 26.0–34.0)
MCHC: 35.9 g/dL (ref 30.0–36.0)
MCV: 87.3 fL (ref 78.0–100.0)
MONOS PCT: 4 % (ref 3–12)
Monocytes Absolute: 0.4 10*3/uL (ref 0.1–1.0)
NEUTROS ABS: 6.3 10*3/uL (ref 1.7–7.7)
Neutrophils Relative %: 66 % (ref 43–77)
Platelets: 192 10*3/uL (ref 150–400)
RBC: 4.95 MIL/uL (ref 4.22–5.81)
RDW: 13.6 % (ref 11.5–15.5)
WBC: 9.8 10*3/uL (ref 4.0–10.5)

## 2014-04-25 LAB — CBG MONITORING, ED: Glucose-Capillary: 386 mg/dL — ABNORMAL HIGH (ref 70–99)

## 2014-04-25 MED ORDER — CLINDAMYCIN HCL 300 MG PO CAPS: 300.0000 mg | ORAL_CAPSULE | Freq: Three times a day (TID) | ORAL | Status: AC

## 2014-04-25 MED ORDER — CLINDAMYCIN HCL 300 MG PO CAPS
300.0000 mg | ORAL_CAPSULE | Freq: Once | ORAL | Status: AC
  Administered 2014-04-25: 300 mg via ORAL
  Filled 2014-04-25: qty 1

## 2014-04-25 MED ORDER — HYDROCODONE-ACETAMINOPHEN 5-325 MG PO TABS
1.0000 | ORAL_TABLET | Freq: Once | ORAL | Status: AC
  Administered 2014-04-25: 1 via ORAL
  Filled 2014-04-25: qty 1

## 2014-04-25 MED ORDER — HYDROCODONE-ACETAMINOPHEN 5-325 MG PO TABS: 1.0000 | ORAL_TABLET | Freq: Four times a day (QID) | ORAL | Status: AC | PRN

## 2014-04-25 NOTE — ED Notes (Signed)
Family to nurses station- reporting pt is getting irritable and that he needs to eat or we need to let them go. Previous cbg reads 386. Family informed about delay.

## 2014-04-25 NOTE — Discharge Instructions (Signed)
Cellulitis Cellulitis is an infection of the skin and the tissue beneath it. The infected area is usually red and tender. Cellulitis occurs most often in the arms and lower legs.  CAUSES  Cellulitis is caused by bacteria that enter the skin through cracks or cuts in the skin. The most common types of bacteria that cause cellulitis are Staphylococcus and Streptococcus. SYMPTOMS   Redness and warmth.  Swelling.  Tenderness or pain.  Fever. DIAGNOSIS  Your caregiver can usually determine what is wrong based on a physical exam. Blood tests may also be done. TREATMENT  Treatment usually involves taking an antibiotic medicine. HOME CARE INSTRUCTIONS   Take your antibiotics as directed. Finish them even if you start to feel better.  Keep the infected arm or leg elevated to reduce swelling.  Apply a warm cloth to the affected area up to 4 times per day to relieve pain.  Only take over-the-counter or prescription medicines for pain, discomfort, or fever as directed by your caregiver.  Keep all follow-up appointments as directed by your caregiver. SEEK MEDICAL CARE IF:   You notice red streaks coming from the infected area.  Your red area gets larger or turns dark in color.  Your bone or joint underneath the infected area becomes painful after the skin has healed.  Your infection returns in the same area or another area.  You notice a swollen bump in the infected area.  You develop new symptoms. SEEK IMMEDIATE MEDICAL CARE IF:   You have a fever.  You feel very sleepy.  You develop vomiting or diarrhea.  You have a general ill feeling (malaise) with muscle aches and pains. MAKE SURE YOU:   Understand these instructions.  Will watch your condition.  Will get help right away if you are not doing well or get worse. Document Released: 08/10/2005 Document Revised: 05/01/2012 Document Reviewed: 01/16/2012 Exit[employer] Patient Information 2014 [employer].  

## 2014-04-25 NOTE — ED Notes (Signed)
Pharmacy called and is sending cleocin

## 2014-04-25 NOTE — ED Notes (Signed)
Pt brought to room with family in tow; Resident, MD in with pt at this time; Steward DroneBrenda, RN and Maralyn SagoSarah, [place] aware

## 2014-04-25 NOTE — ED Notes (Signed)
Pt notified of blood sugar 386 and he continued to drink his Pepsi.  Pt stated his normal level is 400.

## 2014-04-25 NOTE — ED Notes (Signed)
The pt has a pimple on his head and he has an infected foot for 2 weeks .  He is a diabetic and takes insulin.Randall Carroll.  His rt foot has an infection rt foot

## 2014-04-25 NOTE — ED Provider Notes (Signed)
CSN: [id number]     Arrival date & time 04/25/14  1510 History   First MD Initiated Contact with Patient 04/25/14 1536     Chief Complaint  Patient presents with  . the pt has 2 complaints      (Consider location/radiation/quality/duration/timing/severity/associated sxs/prior Treatment) Patient is a 48 y.o. male presenting with toe pain. The history is provided by the patient. No language interpreter was used.  Toe Pain This is a recurrent problem. The current episode started 1 to 4 weeks ago. The problem occurs constantly. The problem has been unchanged. Associated symptoms include a rash (erythema around 'bite' on head). Pertinent negatives include no abdominal pain, anorexia, arthralgias, chest pain, chills, congestion, coughing, fever, headaches, neck pain, numbness or vomiting. Nothing aggravates the symptoms. He has tried nothing for the symptoms. The treatment provided no relief.    Past Medical History  Diagnosis Date  . Diabetes mellitus   . Hypertension   . Neuropathic pain    History reviewed. No pertinent past surgical history. No family history on file. History  Substance Use Topics  . Smoking status: Current Every Day Smoker -- 0.50 packs/day  . Smokeless tobacco: Not on file  . Alcohol Use: No    Review of Systems  Constitutional: Negative for fever and chills.  HENT: Negative for congestion and rhinorrhea.   Eyes: Negative for pain.  Respiratory: Negative for cough and shortness of breath.   Cardiovascular: Negative for chest pain and palpitations.  Gastrointestinal: Negative for vomiting, abdominal pain, diarrhea, constipation and anorexia.  Endocrine: Negative for polydipsia and polyuria.  Genitourinary: Negative for dysuria and flank pain.  Musculoskeletal: Negative for arthralgias, back pain and neck pain.  Skin: Positive for rash (erythema around 'bite' on head). Negative for color change and wound.  Neurological: Negative for dizziness, numbness and  headaches.      Allergies  Review of patient's allergies indicates no known allergies.  Home Medications   Prior to Admission medications   Medication Sig Start Date End Date Taking? Authorizing Provider  amitriptyline (ELAVIL) 100 MG tablet Take 100 mg by mouth 2 (two) times daily.   Yes Historical Provider, MD  busPIRone (BUSPAR) 10 MG tablet Take 20 mg by mouth 3 (three) times daily.   Yes Historical Provider, MD  cyclobenzaprine (FLEXERIL) 10 MG tablet Take 1 tablet (10 mg total) by mouth 2 (two) times daily as needed for muscle spasms. 02/03/14  Yes Jimmye Normanavid John Smith, NP  glipiZIDE (GLUCOTROL) 10 MG tablet Take 10 mg by mouth 2 (two) times daily before a meal.   Yes Historical Provider, MD  ibuprofen (ADVIL,MOTRIN) 200 MG tablet Take 800 mg by mouth 4 (four) times daily as needed for moderate pain (pain).    Yes Historical Provider, MD  Insulin Aspart Prot & Aspart (NOVOLOG MIX 70/30 FLEXPEN [place]) Inject 30 Units into the skin 3 (three) times daily.   Yes Historical Provider, MD  lamoTRIgine (LAMICTAL) 100 MG tablet Take 100 mg by mouth 2 (two) times daily.   Yes Historical Provider, MD  lisinopril-hydrochlorothiazide (PRINZIDE,ZESTORETIC) 10-12.5 MG per tablet Take 1 tablet by mouth every morning.   Yes Historical Provider, MD  lithium 300 MG tablet Take 900 mg by mouth 3 (three) times daily.    Yes Historical Provider, MD  oxyCODONE-acetaminophen (PERCOCET/ROXICET) 5-325 MG per tablet Take 1 tablet by mouth every 8 (eight) hours as needed for severe pain. 02/03/14  Yes Jimmye Normanavid John Smith, NP  risperiDONE (RISPERDAL) 3 MG tablet Take 9 mg by  mouth 2 (two) times daily.    Yes Historical Provider, MD  sitaGLIPtin-metformin (JANUMET) 50-1000 MG per tablet Take 1 tablet by mouth 2 (two) times daily with a meal.   Yes Historical Provider, MD  clindamycin (CLEOCIN) 300 MG capsule Take 1 capsule (300 mg total) by mouth 3 (three) times daily. 04/25/14 05/05/14  Marily MemosJason Korrey Schleicher, MD   HYDROcodone-acetaminophen (NORCO/VICODIN) 5-325 MG per tablet Take 1 tablet by mouth every 6 (six) hours as needed for moderate pain. 04/25/14   Marily MemosJason Kerby Hockley, MD   BP 139/87  Pulse 91  Temp(Src) 98.1 F (36.7 C) (Oral)  Resp 15  SpO2 97% Physical Exam  Nursing note and vitals reviewed. Constitutional: He is oriented to person, place, and time. He appears well-developed and well-nourished.  HENT:  Head: Normocephalic and atraumatic.  Eyes: Conjunctivae and EOM are normal. Pupils are equal, round, and reactive to light.  Cardiovascular: Normal rate and regular rhythm.   Pulmonary/Chest: Effort normal and breath sounds normal.  Musculoskeletal: Normal range of motion.  Neurological: He is alert and oriented to person, place, and time.  Skin: Rash (5-6 cm area of erythema, ttp, minimal swelling without fluctuance on crown of head with a scab on anterior area. Also with slightly swollen right first toe but no fluctuance or erythema. Has neuropathy, so difficult to assess pain in that toe.) noted.    ED Course  Procedures (including critical care time) Labs Review Labs Reviewed  COMPREHENSIVE METABOLIC PANEL - Abnormal; Notable for the following:    Sodium 135 (*)    Glucose, Bld 388 (*)    Alkaline Phosphatase 137 (*)    All other components within normal limits  CBG MONITORING, ED - Abnormal; Notable for the following:    Glucose-Capillary 386 (*)    All other components within normal limits  CBC WITH DIFFERENTIAL    Imaging Review Dg Foot 2 Views Right  04/25/2014   CLINICAL DATA:  Infection of the RIGHT great toe for 2 weeks, diabetes  EXAM: RIGHT FOOT - 2 VIEW  COMPARISON:  RIGHT great toe over radiographs 11/22/2012  FINDINGS: Osseous mineralization normal.  Joint spaces preserved.  Sclerotic line at tuft of distal phalanx great toe is unchanged since 2014.  No acute fracture, dislocation, or bone destruction.  Soft tissues unremarkable.  IMPRESSION: No acute abnormalities.    Electronically Signed   By: Ulyses SouthwardMark  Boles M.D.   On: 04/25/2014 16:46     EKG Interpretation None      MDM   Final diagnoses:  Cellulitis    48 yo M w/ about a week of redness, pain and swelling to the crown of his head around a previous bug 'bite'. Also with swelling and redness of his first right toe which he drained with his own knife a few days ago and has not been swelling since then. Exam with 5-6 cm area of erythema, ttp, minimal swelling without fluctuance on crown of head with a scab on anterior area. Also with slightly swollen right first toe but no fluctuance or erythema. Has neuropathy, so difficult to assess pain in that toe.  Patient likely had paronychia on toe, which he fixed. Will get XR to screen for osteo. Possibly active infection now, but no evidence of fluctuance needing drainage. Also with likely cellulitis on head. If xr negative then will give Rx for abx.   xr negative for bony problems. clinda and pain meds given here and rx for same. Patient asymptomatic from elevated glucose and it  is actually improved. Will go home and take insulin as directed. Will FU w/ PCP.   Marily Memos, MD 04/26/14 [phone]

## 2014-04-26 NOTE — ED Provider Notes (Signed)
Medical screening examination/treatment/procedure(s) were conducted as a shared visit with resident-physician practitioner(s) and myself.  I personally evaluated the patient during the encounter.  Pt is a 48 y.o. male with pmhx as above presenting with healed paronychia of R great toe (I&D done by patient) as well as localized area of cellulitis of forehead w/ underlying fluctuance.  Pt found to have evidence of bony injury on XR foot.  Will treat scalp cellulitis w/ clinda. Return precautions given for new or worsening symptoms including worsening pain, fever, redness.     Shanna CiscoMegan E Docherty, MD 04/26/14 1136

## 2014-06-28 ENCOUNTER — Encounter (HOSPITAL_COMMUNITY): Payer: Self-pay | Admitting: Emergency Medicine

## 2014-06-28 ENCOUNTER — Emergency Department (HOSPITAL_COMMUNITY)
Admission: EM | Admit: 2014-06-28 | Discharge: 2014-07-15 | Disposition: E | Attending: Emergency Medicine | Admitting: Emergency Medicine

## 2014-06-28 DIAGNOSIS — F17200 Nicotine dependence, unspecified, uncomplicated: Secondary | ICD-10-CM | POA: Diagnosis not present

## 2014-06-28 DIAGNOSIS — E119 Type 2 diabetes mellitus without complications: Secondary | ICD-10-CM | POA: Diagnosis not present

## 2014-06-28 DIAGNOSIS — I10 Essential (primary) hypertension: Secondary | ICD-10-CM | POA: Diagnosis not present

## 2014-06-28 DIAGNOSIS — R231 Pallor: Secondary | ICD-10-CM | POA: Insufficient documentation

## 2014-06-28 DIAGNOSIS — R4182 Altered mental status, unspecified: Secondary | ICD-10-CM | POA: Diagnosis not present

## 2014-06-28 DIAGNOSIS — K219 Gastro-esophageal reflux disease without esophagitis: Secondary | ICD-10-CM | POA: Diagnosis not present

## 2014-06-28 DIAGNOSIS — K920 Hematemesis: Secondary | ICD-10-CM | POA: Diagnosis present

## 2014-06-28 DIAGNOSIS — I469 Cardiac arrest, cause unspecified: Secondary | ICD-10-CM | POA: Diagnosis not present

## 2014-06-28 DIAGNOSIS — R61 Generalized hyperhidrosis: Secondary | ICD-10-CM | POA: Diagnosis not present

## 2014-06-28 DIAGNOSIS — I959 Hypotension, unspecified: Secondary | ICD-10-CM | POA: Diagnosis not present

## 2014-06-28 DIAGNOSIS — Z79899 Other long term (current) drug therapy: Secondary | ICD-10-CM | POA: Insufficient documentation

## 2014-06-28 DIAGNOSIS — IMO0001 Reserved for inherently not codable concepts without codable children: Secondary | ICD-10-CM | POA: Diagnosis not present

## 2014-06-28 DIAGNOSIS — Z794 Long term (current) use of insulin: Secondary | ICD-10-CM | POA: Diagnosis not present

## 2014-06-28 HISTORY — DX: Gastro-esophageal reflux disease without esophagitis: K21.9

## 2014-06-28 LAB — CBG MONITORING, ED: Glucose-Capillary: 165 mg/dL — ABNORMAL HIGH (ref 70–99)

## 2014-06-28 MED ORDER — LIDOCAINE HCL (CARDIAC) 20 MG/ML IV SOLN
INTRAVENOUS | Status: AC
  Filled 2014-06-28: qty 5

## 2014-06-28 MED ORDER — EPINEPHRINE HCL 0.1 MG/ML IJ SOSY
PREFILLED_SYRINGE | INTRAMUSCULAR | Status: DC | PRN
  Administered 2014-06-28: 1 mg via INTRAVENOUS

## 2014-06-28 MED ORDER — SODIUM BICARBONATE 8.4 % IV SOLN
INTRAVENOUS | Status: DC | PRN
  Administered 2014-06-28: 50 meq via INTRAVENOUS

## 2014-06-28 MED ORDER — ETOMIDATE 2 MG/ML IV SOLN
INTRAVENOUS | Status: AC
  Filled 2014-06-28: qty 20

## 2014-06-28 MED ORDER — CALCIUM CHLORIDE 10 % IV SOLN
INTRAVENOUS | Status: DC | PRN
  Administered 2014-06-28: 1 g via INTRAVENOUS

## 2014-06-28 MED ORDER — SUCCINYLCHOLINE CHLORIDE 20 MG/ML IJ SOLN
INTRAMUSCULAR | Status: AC
  Filled 2014-06-28: qty 1

## 2014-06-28 MED ORDER — ROCURONIUM BROMIDE 50 MG/5ML IV SOLN
INTRAVENOUS | Status: AC
  Filled 2014-06-28: qty 2

## 2014-06-28 NOTE — ED Notes (Signed)
Patient [facility] with police with original report of patient vomiting blood. Jail report states patient was vomiting bile and was A/O x4 originally and then became confused with RUQ pain and was in mild to moderate distress. EMS reports patient was combative, pale in color and was originally ST on monitor when he went into Afib RVR where he spontaneously converted on his own. During transport patient went into a wide complex VT when he dropped his HR to the 60's and converted spontaneously to a ST rhythm. VSS, A/O. Upon arrived to the ER patient was agitated, grey in color, cold extremities, NRB in place, IV fluids running, ST on the monitor. Patient then went into the same wide complex VT.

## 2014-06-28 NOTE — ED Provider Notes (Signed)
CSN: [id number]     Arrival date & time 06/23/2014  1931 History   First MD Initiated Contact with Patient 07/11/2014 1932     Chief Complaint  Patient presents with  . Hematemesis     (Consider location/radiation/quality/duration/timing/severity/associated sxs/prior Treatment) Patient is a 48 y.o. male presenting with general illness. The history is provided by the patient and the EMS personnel.  Illness Severity:  Severe Onset quality:  Sudden Duration:  1 hour Timing:  Constant Progression:  Worsening Chronicity:  New   48 yo M with a chief complaint of vomiting and altered mental status. Patient is incarcerated was found in his salt vomiting. EMS was contacted patient found to be in a wide complex rhythym, at one point became hypotensive and was in and out of consciousness.  Pale cool extremities.  No further emesis. Patient in and out on initial exam denies CP.   Past Medical History  Diagnosis Date  . Diabetes mellitus   . Hypertension   . Neuropathic pain   . GERD (gastroesophageal reflux disease)    Past Surgical History  Procedure Laterality Date  . Gastric bypass     No family history on file. History  Substance Use Topics  . Smoking status: Current Every Day Smoker -- 0.50 packs/day  . Smokeless tobacco: Not on file  . Alcohol Use: No    Review of Systems  Unable to perform ROS: Mental status change      Allergies  Review of patient's allergies indicates no known allergies.  Home Medications   Prior to Admission medications   Medication Sig Start Date End Date Taking? Authorizing Provider  amitriptyline (ELAVIL) 100 MG tablet Take 100 mg by mouth 2 (two) times daily.    Historical Provider, MD  busPIRone (BUSPAR) 10 MG tablet Take 20 mg by mouth 3 (three) times daily.    Historical Provider, MD  cyclobenzaprine (FLEXERIL) 10 MG tablet Take 1 tablet (10 mg total) by mouth 2 (two) times daily as needed for muscle spasms. 02/03/14   Jimmye Norman, NP   glipiZIDE (GLUCOTROL) 10 MG tablet Take 10 mg by mouth 2 (two) times daily before a meal.    Historical Provider, MD  HYDROcodone-acetaminophen (NORCO/VICODIN) 5-325 MG per tablet Take 1 tablet by mouth every 6 (six) hours as needed for moderate pain. 04/25/14   Marily Memos, MD  ibuprofen (ADVIL,MOTRIN) 200 MG tablet Take 800 mg by mouth 4 (four) times daily as needed for moderate pain (pain).     Historical Provider, MD  Insulin Aspart Prot & Aspart (NOVOLOG MIX 70/30 FLEXPEN [place]) Inject 30 Units into the skin 3 (three) times daily.    Historical Provider, MD  lamoTRIgine (LAMICTAL) 100 MG tablet Take 100 mg by mouth 2 (two) times daily.    Historical Provider, MD  lisinopril-hydrochlorothiazide (PRINZIDE,ZESTORETIC) 10-12.5 MG per tablet Take 1 tablet by mouth every morning.    Historical Provider, MD  lithium 300 MG tablet Take 900 mg by mouth 3 (three) times daily.     Historical Provider, MD  oxyCODONE-acetaminophen (PERCOCET/ROXICET) 5-325 MG per tablet Take 1 tablet by mouth every 8 (eight) hours as needed for severe pain. 02/03/14   Jimmye Norman, NP  risperiDONE (RISPERDAL) 3 MG tablet Take 9 mg by mouth 2 (two) times daily.     Historical Provider, MD  sitaGLIPtin-metformin (JANUMET) 50-1000 MG per tablet Take 1 tablet by mouth 2 (two) times daily with a meal.    Historical Provider, MD   Resp  28  Wt 233 lb (105.688 kg)  SpO2 89% Physical Exam  Constitutional: He appears distressed.  HENT:  Head: Normocephalic and atraumatic.  Eyes: Conjunctivae are normal. Pupils are equal, round, and reactive to light.  Neck: Normal range of motion. Neck supple.  Cardiovascular:  Irregular rate, distant heart sounds  Pulmonary/Chest: He has no wheezes. He has no rales.  Distant breath sounds  Abdominal: There is no tenderness. There is no rebound and no guarding.  Obese   Musculoskeletal: He exhibits no edema and no tenderness.  Neurological: GCS eye subscore is 3. GCS verbal subscore is 4.  GCS motor subscore is 6.  In and out of consciousness  Skin: He is diaphoretic. There is pallor.  Cool to the touch    ED Course  INTUBATION Date/Time: 05-27-14 7:30 PM Performed by: Melene PlanFLOYD, Ilia Dimaano Authorized by: Melene PlanFLOYD, Lorrena Goranson Consent: Verbal consent not obtained. written consent not obtained. The procedure was performed in an emergent situation. Patient identity confirmed: arm band Time out: Immediately prior to procedure a "time out" was called to verify the correct patient, procedure, equipment, support staff and site/side marked as required. Indications: hypoxemia (concern for hypoxemic cardiac arrest) Intubation method: video-assisted Patient status: unconscious Preoxygenation: BVM Tube size: 7.5 mm Tube type: cuffed Number of attempts: 2 Cricoid pressure: no Cords visualized: yes Post-procedure assessment: chest rise and CO2 detector Breath sounds: equal Cuff inflated: yes ETT to lip: 23 cm Tube secured with: ETT holder Patient tolerance: Patient tolerated the procedure well with no immediate complications. Comments: Done to rule out hypoxemia as a cause of arrest, first attempt DL with mac 4, grade 4 view.  Second attempt with glidescope successful.  Done with no pause of compressions   (including critical care time) Labs Review Labs Reviewed  CBG MONITORING, ED - Abnormal; Notable for the following:    Glucose-Capillary 165 (*)    All other components within normal limits  CULTURE, BLOOD (ROUTINE X 2)  CULTURE, BLOOD (ROUTINE X 2)  CBC WITH DIFFERENTIAL  COMPREHENSIVE METABOLIC PANEL  PROTIME-INR  APTT  AMMONIA  I-STAT TROPOININ, ED  I-STAT TROPOININ, ED  I-STAT CG4 LACTIC ACID, ED    Imaging Review No results found.   EKG Interpretation None      MDM   Final diagnoses:  Cardiac arrest    48 yo M who came in with altered mental status status post emesis at prison.  Patient was in a wide complex rhythym. Concern for underlying metabolic abnormalities  with a wide complex rhythym.  He was given calcium. This did not change his tracing.  He went into the ventricular tachycardia after arrival in the ED and became unresponsive. The patient was defibrillated and went into asystole. CPR was started the patient was given epinephrine and bicarbonate. The patient was intubated. Patient continued to be in asystole for the next few rounds of CPR.  Minimal cardiac movement on the bedside echo. CPR was continued the patient was given his third round of epi. He was found to be in a ventricular wide complex rhythm with fibrillation on bedside ultrasound he was defibrillated. Patient went back into asystole. Blood sugar normal. Another round of CPR with epi was performed. No noted cardiac activity on monitor. Patient had CPR for 15 minutes.  suspect probable MI. Feel that TPA would be unhelpful with asytole and no cardiac activity on bedside echo.  Code was called at 2003.  Due to incarceration will be an ME case.    Melene Planan Aleene Swanner, MD  06/22/2014 2201  Melene Plan, MD 07-15-14 [id number]  Melene Plan, MD 2014/07/15 [phone]

## 2014-07-01 NOTE — Accreditation Note (Signed)
o Restraints reported to CMS  Pursuant to regulation 482.13 (G) (3) use of restraints was logged and CMS was notified via email on (08.18.2015 at 1145) by Rosine Door(Shanetra Blumenstock, RN, Patient Insurance account managerafety and Accreditation Coordinator).

## 2014-07-15 NOTE — ED Provider Notes (Signed)
I saw and evaluated the patient, reviewed the resident's note and I agree with the findings and plan.   EKG Interpretation   Date/Time:  Saturday June 28 2014 19:35:03 EDT Ventricular Rate:  64 PR Interval:  322 QRS Duration: 208 QT Interval:  502 QTC Calculation: 518 R Axis:   134 Text Interpretation:  Wide QRS rhythm wide QRS is new since last tracing.  Confirmed by Rubin PayorPICKERING  MD, Harrold DonathNATHAN [phone] on Sep 21, 2014 3:05:42 PM     Patient came in after vomiting at jail. On arrival to the ER he was somewhat confused but he will answer questions. He wide complex rhythm and quickly changed into ventricular tachycardia and decreased responsiveness. Patient was defibrillated into asystole. Prior to the change rhythm he had been given calcium and bicarbonate. CPR was continued. He did have return of rhythm  Briefly be no return of pulse.no cardiac activity on bedside ultrasound. patient was pronounced. The medical examiner was notified.    Cardiopulmonary Resuscitation (CPR) Procedure Note Directed/Performed by: Billee CashingPICKERING,Isamar Nazir R. I personally directed ancillary staff and/or performed CPR in an effort to regain return of spontaneous circulation and to maintain cardiac, neuro and systemic perfusion.    Patient was intubated by Dr. Adela LankFloyd under my direct supervision.  Juliet RudeNathan R. Rubin PayorPickering, MD 01-27-2014 1510

## 2014-07-15 DEATH — deceased
